# Patient Record
Sex: Male | Born: 1935 | Race: White | Hispanic: No | Smoking: Never smoker
Health system: Southern US, Community
[De-identification: ages and names within clinical notes are randomized; demographics above are authoritative.]

## PROBLEM LIST (undated history)

## (undated) DIAGNOSIS — I10 Essential (primary) hypertension: Secondary | ICD-10-CM

## (undated) DIAGNOSIS — M5116 Intervertebral disc disorders with radiculopathy, lumbar region: Secondary | ICD-10-CM

## (undated) DIAGNOSIS — I2510 Atherosclerotic heart disease of native coronary artery without angina pectoris: Secondary | ICD-10-CM

## (undated) DIAGNOSIS — I712 Thoracic aortic aneurysm, without rupture: Secondary | ICD-10-CM

## (undated) DIAGNOSIS — R972 Elevated prostate specific antigen [PSA]: Secondary | ICD-10-CM

## (undated) DIAGNOSIS — M25511 Pain in right shoulder: Secondary | ICD-10-CM

## (undated) DIAGNOSIS — I110 Hypertensive heart disease with heart failure: Secondary | ICD-10-CM

## (undated) DIAGNOSIS — K219 Gastro-esophageal reflux disease without esophagitis: Secondary | ICD-10-CM

## (undated) DIAGNOSIS — Z950 Presence of cardiac pacemaker: Secondary | ICD-10-CM

## (undated) DIAGNOSIS — I429 Cardiomyopathy, unspecified: Secondary | ICD-10-CM

## (undated) DIAGNOSIS — M25569 Pain in unspecified knee: Secondary | ICD-10-CM

## (undated) DIAGNOSIS — I5032 Chronic diastolic (congestive) heart failure: Secondary | ICD-10-CM

## (undated) DIAGNOSIS — R040 Epistaxis: Secondary | ICD-10-CM

## (undated) DIAGNOSIS — E079 Disorder of thyroid, unspecified: Secondary | ICD-10-CM

## (undated) DIAGNOSIS — Z45018 Encounter for adjustment and management of other part of cardiac pacemaker: Secondary | ICD-10-CM

## (undated) DIAGNOSIS — I441 Atrioventricular block, second degree: Secondary | ICD-10-CM

## (undated) DIAGNOSIS — E785 Hyperlipidemia, unspecified: Secondary | ICD-10-CM

## (undated) DIAGNOSIS — M48061 Spinal stenosis, lumbar region without neurogenic claudication: Secondary | ICD-10-CM

## (undated) DIAGNOSIS — I447 Left bundle-branch block, unspecified: Secondary | ICD-10-CM

## (undated) HISTORY — DX: Gastro-esophageal reflux disease without esophagitis: K21.9

## (undated) HISTORY — PX: APPENDECTOMY: SHX54

## (undated) HISTORY — DX: Spinal stenosis, lumbar region without neurogenic claudication: M48.061

## (undated) HISTORY — DX: Encounter for adjustment and management of other part of cardiac pacemaker: Z45.018

## (undated) HISTORY — DX: Pain in unspecified knee: M25.569

## (undated) HISTORY — DX: Essential (primary) hypertension: I10

## (undated) HISTORY — PX: INSERT / REPLACE / REMOVE PACEMAKER: SUR710

## (undated) HISTORY — DX: Left bundle-branch block, unspecified: I44.7

## (undated) HISTORY — DX: Presence of cardiac pacemaker: Z95.0

## (undated) HISTORY — DX: Epistaxis: R04.0

## (undated) HISTORY — DX: Elevated prostate specific antigen (PSA): R97.20

## (undated) HISTORY — DX: Intervertebral disc disorders with radiculopathy, lumbar region: M51.16

## (undated) HISTORY — DX: Thoracic aortic aneurysm, without rupture: I71.2

## (undated) HISTORY — DX: Hypertensive heart disease with heart failure: I11.0

## (undated) HISTORY — DX: Disorder of thyroid, unspecified: E07.9

## (undated) HISTORY — DX: Atrioventricular block, second degree: I44.1

## (undated) HISTORY — PX: OTHER SURGICAL HISTORY: SHX169

## (undated) HISTORY — DX: Chronic diastolic (congestive) heart failure: I50.32

## (undated) HISTORY — DX: Hyperlipidemia, unspecified: E78.5

## (undated) HISTORY — DX: Cardiomyopathy, unspecified: I42.9

## (undated) HISTORY — DX: Atherosclerotic heart disease of native coronary artery without angina pectoris: I25.10

## (undated) HISTORY — DX: Pain in right shoulder: M25.511

---

## 2013-04-15 ENCOUNTER — Emergency Department: Payer: Self-pay | Admitting: Emergency Medicine

## 2013-04-15 LAB — CBC
MCH: 33.1 pg (ref 26.0–34.0)
MCHC: 34.3 g/dL (ref 32.0–36.0)
Platelet: 164 10*3/uL (ref 150–440)
RDW: 14 % (ref 11.5–14.5)
WBC: 9.7 10*3/uL (ref 3.8–10.6)

## 2013-04-15 LAB — SEDIMENTATION RATE: Erythrocyte Sed Rate: 7 mm/hr (ref 0–20)

## 2013-05-01 ENCOUNTER — Ambulatory Visit: Payer: Self-pay | Admitting: General Practice

## 2013-06-05 ENCOUNTER — Encounter (INDEPENDENT_AMBULATORY_CARE_PROVIDER_SITE_OTHER): Payer: Self-pay

## 2013-06-05 ENCOUNTER — Ambulatory Visit (INDEPENDENT_AMBULATORY_CARE_PROVIDER_SITE_OTHER): Payer: Medicare Other

## 2013-06-05 VITALS — BP 151/86 | HR 81 | Resp 16

## 2013-06-05 DIAGNOSIS — B351 Tinea unguium: Secondary | ICD-10-CM

## 2013-06-05 DIAGNOSIS — M79609 Pain in unspecified limb: Secondary | ICD-10-CM

## 2013-06-05 DIAGNOSIS — L600 Ingrowing nail: Secondary | ICD-10-CM

## 2013-06-05 NOTE — Progress Notes (Signed)
  Subjective:    Patient ID: Erik Brock, male    DOB: 03-30-1928, 79 y.o.   MRN: [id number]  HPI trim my nails historically patient's head nail removal of both great toes more than 5 years ago by Dr. Amalia Hailey. At this time having some pain discomfort with thickening yellowing and discoloration of the second digit right second and third digits left foot.     Review of Systems  Constitutional: Negative.   HENT: Negative.   Eyes: Negative.   Respiratory: Negative.   Cardiovascular: Negative.   Gastrointestinal: Negative.   Endocrine: Negative.   Genitourinary: Negative.   Musculoskeletal: Negative.   Skin: Negative.   Allergic/Immunologic: Negative.   Neurological: Negative.   Hematological: Negative.   Psychiatric/Behavioral: Negative.        Objective:   Physical Exam Neurovascular status is intact with pedal pulses palpable DP +2/4 PT plus one over 4 bilateral skin temperature warm turgor normal no edema rubor pallor or varicosities noted. Neurologically epicritic and proprioceptive sensations intact and symmetric bilateral. DTRs not elicited. Dermatologically skin color pigment normal hair growth absent there is thickening yellowing and criptotic incurvation of 3 digits second right second and third left. The remaining digits are relatively normal trophic nonpainful or symptomatic. The affected nails are tender on touch palpation and with enclosed shoe wear and ambulation. Remainder of orthopedic biomechanical exam unremarkable mild Leksell digital contractures are noted no open wounds ulcerations noted. No secondary infection is noted.     Assessment & Plan:  Onychomycosis/painful incurvated deformity mycotic nails x3. Nails debrided at this time painful and symptomatic with instructions to obtain formula 3 which is dispensed at this visit. Apply to affected nails twice daily for 6-12 months duration. Suggest a 3 month followup for palliative nail care and debridement as needed.  Contact us with any changes or exacerbations occur. Next  Harriet Masson DPM

## 2013-06-05 NOTE — Patient Instructions (Signed)
Onychomycosis/Fungal Toenails  WHAT IS IT? An infection that lies within the keratin of your nail plate that is caused by a fungus.  WHY ME? Fungal infections affect all ages, sexes, races, and creeds.  There may be many factors that predispose you to a fungal infection such as age, coexisting medical conditions such as diabetes, or an autoimmune disease; stress, medications, fatigue, genetics, etc.  Bottom line: fungus thrives in a warm, moist environment and your shoes offer such a location.  IS IT CONTAGIOUS? Theoretically, yes.  You do not want to share shoes, nail clippers or files with someone who has fungal toenails.  Walking around barefoot in the same room or sleeping in the same bed is unlikely to transfer the organism.  It is important to realize, however, that fungus can spread easily from one nail to the next on the same foot.  HOW DO WE TREAT THIS?  There are several ways to treat this condition.  Treatment may depend on many factors such as age, medications, pregnancy, liver and kidney conditions, etc.  It is best to ask your doctor which options are available to you.  1. No treatment.   Unlike many other medical concerns, you can live with this condition.  However for many people this can be a painful condition and may lead to ingrown toenails or a bacterial infection.  It is recommended that you keep the nails cut short to help reduce the amount of fungal nail. 2. Topical treatment.  These range from herbal remedies to prescription strength nail lacquers.  About 40-50% effective, topicals require twice daily application for approximately 9 to 12 months or until an entirely new nail has grown out.  The most effective topicals are medical grade medications available through physicians offices. 3. Oral antifungal medications.  With an 80-90% cure rate, the most common oral medication requires 3 to 4 months of therapy and stays in your system for a year as the new nail grows out.  Oral  antifungal medications do require blood work to make sure it is a safe drug for you.  A liver function panel will be performed prior to starting the medication and after the first month of treatment.  It is important to have the blood work performed to avoid any harmful side effects.  In general, this medication safe but blood work is required. 4. Laser Therapy.  This treatment is performed by applying a specialized laser to the affected nail plate.  This therapy is noninvasive, fast, and non-painful.  It is not covered by insurance and is therefore, out of pocket.  The results have been very good with a 80-95% cure rate.  [facility] is the only practice in the area to offer this therapy. 5. Permanent Nail Avulsion.  Removing the entire nail so that a new nail will not grow back. 6. Formula 3: Topical medication can be obtained here at the office. Applied to the affected nails second toe right foot second and third toes left foot. To be applied sparingly twice a day every morning every evening for a period of 6-12 months minimum. Stop if the cause any skin irritation. Followup of nail trimming on a regular basis.

## 2013-07-30 DIAGNOSIS — E785 Hyperlipidemia, unspecified: Secondary | ICD-10-CM

## 2013-07-30 DIAGNOSIS — M48061 Spinal stenosis, lumbar region without neurogenic claudication: Secondary | ICD-10-CM

## 2013-07-30 DIAGNOSIS — R972 Elevated prostate specific antigen [PSA]: Secondary | ICD-10-CM | POA: Insufficient documentation

## 2013-07-30 DIAGNOSIS — I10 Essential (primary) hypertension: Secondary | ICD-10-CM

## 2013-07-30 DIAGNOSIS — K219 Gastro-esophageal reflux disease without esophagitis: Secondary | ICD-10-CM | POA: Insufficient documentation

## 2013-07-30 HISTORY — DX: Elevated prostate specific antigen (PSA): R97.20

## 2013-07-30 HISTORY — DX: Hyperlipidemia, unspecified: E78.5

## 2013-07-30 HISTORY — DX: Spinal stenosis, lumbar region without neurogenic claudication: M48.061

## 2013-07-30 HISTORY — DX: Essential (primary) hypertension: I10

## 2013-07-30 HISTORY — DX: Gastro-esophageal reflux disease without esophagitis: K21.9

## 2013-08-29 ENCOUNTER — Ambulatory Visit: Payer: Medicare Other

## 2013-08-29 VITALS — BP 130/73 | HR 73 | Resp 16

## 2013-08-29 DIAGNOSIS — B351 Tinea unguium: Secondary | ICD-10-CM

## 2013-08-29 DIAGNOSIS — L600 Ingrowing nail: Secondary | ICD-10-CM

## 2013-08-29 DIAGNOSIS — M79609 Pain in unspecified limb: Secondary | ICD-10-CM

## 2013-08-29 NOTE — Progress Notes (Signed)
   Subjective:    Patient ID: Erik Brock, male    DOB: [birth year], 80 y.o.   MRN: [id number]  HPI I am here to get my toenails cut and there is no change in health or medicines    Review of Systems no new changes or findings     Objective:   Physical Exam Vascular status is intact and unchanged pedal pulses palpable DP +2/4 bilateral PT plus one over 4 bilateral Refill timed 3-4 seconds skin temperature warm turgor normal no edema rubor or varicosities noted. Epicritic and progressive sensations intact bilateral. Dermatologically skin color pigment normal hair growth absent nails thick brittle criptotic incurvated friable 1 through 5 right 2 through 5 left left hallux has had nails removed although they're 2 small spicule still present. No secondary infection is no discharge or drainage no malodor.       Assessment & Plan:  Assessment onychomycosis criptotic is incurvation and dystrophy of nails painful tender symptomatic x9 cleansed and debridement of painful mycotic nails return in 3 months for continued palliative nail care and as-needed basis for future followup  Harriet Masson DPM

## 2013-08-29 NOTE — Patient Instructions (Signed)

## 2013-09-04 ENCOUNTER — Ambulatory Visit: Payer: Medicare Other

## 2013-12-05 ENCOUNTER — Ambulatory Visit (INDEPENDENT_AMBULATORY_CARE_PROVIDER_SITE_OTHER): Payer: Medicare Other

## 2013-12-05 VITALS — BP 120/62 | HR 64 | Resp 18

## 2013-12-05 DIAGNOSIS — B351 Tinea unguium: Secondary | ICD-10-CM

## 2013-12-05 DIAGNOSIS — M79609 Pain in unspecified limb: Secondary | ICD-10-CM

## 2013-12-05 NOTE — Patient Instructions (Signed)
Ringworm, Nail A fungal infection of the nail (tinea unguium/onychomycosis) is common. It is common as the visible part of the nail is composed of dead cells which have no blood supply to help prevent infection. It occurs because fungi are everywhere and will pick any opportunity to grow on any dead material. Because nails are very slow growing they require up to 2 years of treatment with anti-fungal medications. The entire nail back to the base is infected. This includes approximately  of the nail which you cannot see. If your caregiver has prescribed a medication by mouth, take it every day and as directed. No progress will be seen for at least 6 to 9 months. Do not be disappointed! Because fungi live on dead cells with little or no exposure to blood supply, medication delivery to the infection is slow; thus the cure is slow. It is also why you can observe no progress in the first 6 months. The nail becoming cured is the base of the nail, as it has the blood supply. Topical medication such as creams and ointments are usually not effective. Important in successful treatment of nail fungus is closely following the medication regimen that your doctor prescribes. Sometimes you and your caregiver may elect to speed up this process by surgical removal of all the nails. Even this may still require 6 to 9 months of additional oral medications. See your caregiver as directed. Remember there will be no visible improvement for at least 6 months. See your caregiver sooner if other signs of infection (redness and swelling) develop. Document Released: 07/01/2000 Document Revised: 09/26/2011 Document Reviewed: 09/09/2008 [employer] Patient Information 2014 [employer].  

## 2013-12-05 NOTE — Progress Notes (Signed)
   Subjective:    Patient ID: Erik Brock, male    DOB: [birth year], 80 y.o.   MRN: [id number]  HPI I am here to get my toenails trimmed up     Review of Systems No new changes or systemic findings noted    Objective:   Physical Exam  lower extremity objective findings neurovascular status is intact pedal pulses palpable nails thick criptotic incurvated 2 through 5 bilateral small speculum left hallux nails debrided as well no secondary infection is no discharge or drainage       Assessment & Plan:  Assessment this time is mycotic nails criptotic nails with discoloration and friability tenderness 2 through 5 bilateral mycotic nails debrided x8 this time return for future palliative care and as-needed basis  Harriet Masson DPM

## 2014-03-06 ENCOUNTER — Ambulatory Visit: Payer: Medicare Other

## 2014-03-06 VITALS — BP 151/77 | HR 69 | Resp 18

## 2014-03-06 DIAGNOSIS — M79676 Pain in unspecified toe(s): Secondary | ICD-10-CM

## 2014-03-06 DIAGNOSIS — B351 Tinea unguium: Secondary | ICD-10-CM

## 2014-03-06 DIAGNOSIS — L600 Ingrowing nail: Secondary | ICD-10-CM

## 2014-03-06 DIAGNOSIS — M79609 Pain in unspecified limb: Secondary | ICD-10-CM

## 2014-03-06 NOTE — Patient Instructions (Signed)
Onychomycosis/Fungal Toenails  WHAT IS IT? An infection that lies within the keratin of your nail plate that is caused by a fungus.  WHY ME? Fungal infections affect all ages, sexes, races, and creeds.  There may be many factors that predispose you to a fungal infection such as age, coexisting medical conditions such as diabetes, or an autoimmune disease; stress, medications, fatigue, genetics, etc.  Bottom line: fungus thrives in a warm, moist environment and your shoes offer such a location.  IS IT CONTAGIOUS? Theoretically, yes.  You do not want to share shoes, nail clippers or files with someone who has fungal toenails.  Walking around barefoot in the same room or sleeping in the same bed is unlikely to transfer the organism.  It is important to realize, however, that fungus can spread easily from one nail to the next on the same foot.  HOW DO WE TREAT THIS?  There are several ways to treat this condition.  Treatment may depend on many factors such as age, medications, pregnancy, liver and kidney conditions, etc.  It is best to ask your doctor which options are available to you.  1. No treatment.   Unlike many other medical concerns, you can live with this condition.  However for many people this can be a painful condition and may lead to ingrown toenails or a bacterial infection.  It is recommended that you keep the nails cut short to help reduce the amount of fungal nail. 2. Topical treatment.  These range from herbal remedies to prescription strength nail lacquers.  About 40-50% effective, topicals require twice daily application for approximately 9 to 12 months or until an entirely new nail has grown out.  The most effective topicals are medical grade medications available through physicians offices. 3. Oral antifungal medications.  With an 80-90% cure rate, the most common oral medication requires 3 to 4 months of therapy and stays in your system for a year as the new nail grows out.  Oral  antifungal medications do require blood work to make sure it is a safe drug for you.  A liver function panel will be performed prior to starting the medication and after the first month of treatment.  It is important to have the blood work performed to avoid any harmful side effects.  In general, this medication safe but blood work is required. 4. Laser Therapy.  This treatment is performed by applying a specialized laser to the affected nail plate.  This therapy is noninvasive, fast, and non-painful.  It is not covered by insurance and is therefore, out of pocket.  The results have been very good with a 80-95% cure rate.  [facility] is the only practice in the area to offer this therapy. 5. Permanent Nail Avulsion.  Removing the entire nail so that a new nail will not grow back.  Continue applying topical nail antifungal suggested formula 3. Applied twice daily to each affected toenail for minimum of 12 months

## 2014-03-06 NOTE — Progress Notes (Addendum)
   Subjective:    Patient ID: Erik Brock, male    DOB: Mar 17, 1928, 80 y.o.   MRN: [id number]  HPI I AM HERE TO HAVE MY TOENAILS TRIMMED UP    Review of Systems no new findings or systemic changes noted     Objective:   Physical Exam Extremity objective findings as follows vascular status is intact with pedal pulses palpable DP +2/4 PT posterior were for Refill time 3 seconds all digits epicritic sensations intact and symmetric there is normal plantar response and DT the keratin was in nails thick dystrophic and brittle 2 through 5 bilateral they're spicules the hallux nails bilateral on previous nail excisions remaining nails show yellow thickening discoloration and friability consistent with onychomycosis.       Assessment & Plan:  Assessment onychomycosis painful criptotic incurvated nails 2 through 5 bilateral debrided painful mycotic nails x8 also some spicules both hallux are also debrided recommended to continue with topical formula 3 antifungal application as instructed reappointed 3 months for followup for nail check and debridement as needed  Harriet Masson DPM

## 2014-06-04 ENCOUNTER — Ambulatory Visit: Payer: PRIVATE HEALTH INSURANCE

## 2014-06-16 ENCOUNTER — Ambulatory Visit (INDEPENDENT_AMBULATORY_CARE_PROVIDER_SITE_OTHER): Payer: Medicare Other

## 2014-06-16 VITALS — BP 124/69 | HR 60 | Resp 12

## 2014-06-16 DIAGNOSIS — B351 Tinea unguium: Secondary | ICD-10-CM

## 2014-06-16 DIAGNOSIS — M79676 Pain in unspecified toe(s): Secondary | ICD-10-CM

## 2014-06-16 NOTE — Progress Notes (Signed)
   Subjective:    Patient ID: Erik Brock, male    DOB: 02-14-1928, 80 y.o.   MRN: [id number]  HPI  TRIM MY TOENAILS.  Review of Systems no new findings or systemic changes. Patient's recent A1c was 5.7 there is some concern that isn't a prediabetic state although currently diet controlled       Objective:   Physical Exam 80 year old male well-developed well-nourished oriented 3 presents this time with pedal pulses palpable DP +2 over 4 PT +2 over 4 capillary refill time 3 seconds all digits epicritic and proprioceptive sensations appear to be intact although diminished on Semmes Weinstein to the digits nails thick criptotic incurvated 2 through 5 bilateral small nail spicule was removed from both hallux as well this time. No open wounds no ulcers no secondary infections       Assessment & Plan:  Assessment possibly early prediabetic state has thick painful mycotic nails 2 through 5 bilateral which are debrided at this time return in 3 months for follow-up and continued palliative nail care is needed maintain appropriate accommodative shoes at all times  Harriet Masson DPM

## 2014-06-16 NOTE — Patient Instructions (Signed)

## 2014-09-15 ENCOUNTER — Ambulatory Visit (INDEPENDENT_AMBULATORY_CARE_PROVIDER_SITE_OTHER): Payer: Medicare Other

## 2014-09-15 VITALS — BP 125/71 | HR 70 | Resp 18

## 2014-09-15 DIAGNOSIS — B351 Tinea unguium: Secondary | ICD-10-CM

## 2014-09-15 DIAGNOSIS — L600 Ingrowing nail: Secondary | ICD-10-CM

## 2014-09-15 DIAGNOSIS — M79676 Pain in unspecified toe(s): Secondary | ICD-10-CM | POA: Diagnosis not present

## 2014-09-15 NOTE — Progress Notes (Signed)
   Subjective:    Patient ID: Erik Brock, male    DOB: 11-Feb-1928, 81 y.o.   MRN: [id number]  HPI I AM HERE TO GET MY TOENAILS TRIMMED UP    Review of Systems no new findings or systemic changes     Objective:   Physical Exam pedal pulses are palpable epicritic and proprioceptive sensations intact DP +2 PT +2 over 4 bilateral Refill time 3 seconds no open wounds no ulcers no secondary infections patient is had previous nail excisions both hallux is incurvated brittle crumbly dystrophic nails 2 through 5 bilateral. Small spicule on the left hallux nails also debridement this time.      Assessment & Plan:  Assessment this time thick painful criptotic nails 2 through 5 bilateral debrided at this time the presence of pain symptomology and nail dystrophy and discoloration has had previous nail excisions both hallux with good success nail spicule left hallux also debridement at this time return in 3 months for follow-up continued palliative care in the future as needed  Harriet Masson DPM

## 2014-10-14 DIAGNOSIS — M25511 Pain in right shoulder: Secondary | ICD-10-CM

## 2014-10-14 HISTORY — DX: Pain in right shoulder: M25.511

## 2014-11-11 DIAGNOSIS — M25569 Pain in unspecified knee: Secondary | ICD-10-CM | POA: Insufficient documentation

## 2014-11-11 DIAGNOSIS — M5116 Intervertebral disc disorders with radiculopathy, lumbar region: Secondary | ICD-10-CM | POA: Insufficient documentation

## 2014-11-11 HISTORY — DX: Intervertebral disc disorders with radiculopathy, lumbar region: M51.16

## 2014-11-11 HISTORY — DX: Pain in unspecified knee: M25.569

## 2014-12-08 DIAGNOSIS — I447 Left bundle-branch block, unspecified: Secondary | ICD-10-CM | POA: Insufficient documentation

## 2014-12-08 DIAGNOSIS — I2510 Atherosclerotic heart disease of native coronary artery without angina pectoris: Secondary | ICD-10-CM | POA: Insufficient documentation

## 2014-12-08 DIAGNOSIS — I441 Atrioventricular block, second degree: Secondary | ICD-10-CM

## 2014-12-08 HISTORY — DX: Left bundle-branch block, unspecified: I44.7

## 2014-12-08 HISTORY — DX: Atrioventricular block, second degree: I44.1

## 2014-12-08 HISTORY — DX: Atherosclerotic heart disease of native coronary artery without angina pectoris: I25.10

## 2014-12-22 ENCOUNTER — Ambulatory Visit: Payer: Medicare Other

## 2014-12-22 ENCOUNTER — Encounter: Payer: Self-pay | Admitting: Podiatry

## 2014-12-22 ENCOUNTER — Ambulatory Visit (INDEPENDENT_AMBULATORY_CARE_PROVIDER_SITE_OTHER): Payer: Medicare Other | Admitting: Podiatry

## 2014-12-22 VITALS — BP 119/67 | HR 75 | Resp 18

## 2014-12-22 DIAGNOSIS — Z950 Presence of cardiac pacemaker: Secondary | ICD-10-CM

## 2014-12-22 DIAGNOSIS — Z45018 Encounter for adjustment and management of other part of cardiac pacemaker: Secondary | ICD-10-CM

## 2014-12-22 DIAGNOSIS — M79676 Pain in unspecified toe(s): Secondary | ICD-10-CM

## 2014-12-22 DIAGNOSIS — B351 Tinea unguium: Secondary | ICD-10-CM | POA: Diagnosis not present

## 2014-12-22 HISTORY — DX: Encounter for adjustment and management of other part of cardiac pacemaker: Z45.018

## 2014-12-22 HISTORY — DX: Presence of cardiac pacemaker: Z95.0

## 2014-12-22 NOTE — Progress Notes (Signed)
Patient ID: Erik Brock, male   DOB: 1928/04/25, 81 y.o.   MRN: [id number] @BARCODE2D (Error - No data available.)@Complaint :  Visit Type: Patient returns to my office for continued preventative foot care services. Complaint: Patient states" my nails have grown long and thick and become painful to walk and wear shoes" . He presents for preventative foot care services. No changes to ROS  Podiatric Exam: Vascular: dorsalis pedis and posterior tibial pulses are palpable bilateral. Capillary return is immediate. Temperature gradient is WNL. Skin turgor WNL  Sensorium: Normal Semmes Weinstein monofilament test. Normal tactile sensation bilaterally. Nail Exam: Pt has thick disfigured discolored nails with subungual debris noted bilateral entire nail hallux through fifth toenails Ulcer Exam: There is no evidence of ulcer or pre-ulcerative changes or infection. Orthopedic Exam: Muscle tone and strength are WNL. No limitations in general ROM. No crepitus or effusions noted. Foot type and digits show no abnormalities. Bony prominences are unremarkable. Skin: No Porokeratosis. No infection or ulcers  Diagnosis:  Tinea unguium, Pain in right toe, pain in left toes  Treatment & Plan Procedures and Treatment: Consent by patient was obtained for treatment procedures. The patient understood the discussion of treatment and procedures well. All questions were answered thoroughly reviewed. Debridement of mycotic and hypertrophic toenails, 1 through 5 bilateral and clearing of subungual debris. No ulceration, no infection noted.  Return Visit-Office Procedure: Patient instructed to return to the office for a follow up visit 3 months for continued evaluation and treatment.

## 2015-03-30 ENCOUNTER — Encounter: Payer: Self-pay | Admitting: Podiatry

## 2015-03-30 ENCOUNTER — Ambulatory Visit (INDEPENDENT_AMBULATORY_CARE_PROVIDER_SITE_OTHER): Payer: Medicare Other | Admitting: Podiatry

## 2015-03-30 DIAGNOSIS — B351 Tinea unguium: Secondary | ICD-10-CM | POA: Diagnosis not present

## 2015-03-30 DIAGNOSIS — M79676 Pain in unspecified toe(s): Secondary | ICD-10-CM

## 2015-03-30 NOTE — Progress Notes (Signed)
Patient ID: Erik Brock, male   DOB: [birth year], 81 y.o.   MRN: [id number] @BARCODE2D (Error - No data available.)@Complaint :  Visit Type: Patient returns to my office for continued preventative foot care services. Complaint: Patient states" my nails have grown long and thick and become painful to walk and wear shoes" . He presents for preventative foot care services. No changes to ROS  Podiatric Exam: Vascular: dorsalis pedis and posterior tibial pulses are palpable bilateral. Capillary return is immediate. Temperature gradient is WNL. Skin turgor WNL  Sensorium: Normal Semmes Weinstein monofilament test. Normal tactile sensation bilaterally. Nail Exam: Pt has thick disfigured discolored nails with subungual debris noted  hallux through 2-5 B/L.  Nail spicule left hallux nail. Nail plate removed from both hallux nail beds. Ulcer Exam: There is no evidence of ulcer or pre-ulcerative changes or infection. Orthopedic Exam: Muscle tone and strength are WNL. No limitations in general ROM. No crepitus or effusions noted. Foot type and digits show no abnormalities. Bony prominences are unremarkable. Skin: No Porokeratosis. No infection or ulcers  Diagnosis:  Tinea unguium, Pain in right toe, pain in left toes  Treatment & Plan Procedures and Treatment: Consent by patient was obtained for treatment procedures. The patient understood the discussion of treatment and procedures well. All questions were answered thoroughly reviewed. Debridement of mycotic and hypertrophic toenails, 1 through 5 bilateral and clearing of subungual debris. No ulceration, no infection noted.  Return Visit-Office Procedure: Patient instructed to return to the office for a follow up visit 3 months for continued evaluation and treatment.

## 2015-04-05 DIAGNOSIS — I5032 Chronic diastolic (congestive) heart failure: Secondary | ICD-10-CM

## 2015-04-05 DIAGNOSIS — E785 Hyperlipidemia, unspecified: Secondary | ICD-10-CM

## 2015-04-05 DIAGNOSIS — I110 Hypertensive heart disease with heart failure: Secondary | ICD-10-CM | POA: Insufficient documentation

## 2015-04-05 HISTORY — DX: Hypertensive heart disease with heart failure: I11.0

## 2015-04-05 HISTORY — DX: Hyperlipidemia, unspecified: E78.5

## 2015-04-05 HISTORY — DX: Chronic diastolic (congestive) heart failure: I50.32

## 2015-06-08 ENCOUNTER — Other Ambulatory Visit: Payer: Self-pay | Admitting: Orthopedic Surgery

## 2015-06-08 DIAGNOSIS — M546 Pain in thoracic spine: Secondary | ICD-10-CM

## 2015-06-18 ENCOUNTER — Ambulatory Visit
Admission: RE | Admit: 2015-06-18 | Discharge: 2015-06-18 | Disposition: A | Discharge status: Home / Self Care | Payer: Medicare Other | Source: Ambulatory Visit | Attending: Orthopedic Surgery | Admitting: Orthopedic Surgery

## 2015-06-18 DIAGNOSIS — M546 Pain in thoracic spine: Secondary | ICD-10-CM | POA: Diagnosis not present

## 2015-06-18 DIAGNOSIS — I700 Atherosclerosis of aorta: Secondary | ICD-10-CM | POA: Diagnosis not present

## 2015-06-18 DIAGNOSIS — I517 Cardiomegaly: Secondary | ICD-10-CM | POA: Insufficient documentation

## 2015-06-18 DIAGNOSIS — I2510 Atherosclerotic heart disease of native coronary artery without angina pectoris: Secondary | ICD-10-CM | POA: Diagnosis not present

## 2015-06-18 DIAGNOSIS — M47892 Other spondylosis, cervical region: Secondary | ICD-10-CM | POA: Insufficient documentation

## 2015-06-29 ENCOUNTER — Encounter: Payer: Self-pay | Admitting: Podiatry

## 2015-06-29 ENCOUNTER — Ambulatory Visit (INDEPENDENT_AMBULATORY_CARE_PROVIDER_SITE_OTHER): Payer: Medicare Other | Admitting: Podiatry

## 2015-06-29 DIAGNOSIS — B351 Tinea unguium: Secondary | ICD-10-CM

## 2015-06-29 DIAGNOSIS — M79676 Pain in unspecified toe(s): Secondary | ICD-10-CM

## 2015-06-29 NOTE — Progress Notes (Signed)
Patient ID: Erik Brock, male   DOB: May 28, 1928, 81 y.o.   MRN: [id number] Complaint:  Visit Type: Patient returns to my office for continued preventative foot care services. Complaint: Patient states" my nails have grown long and thick and become painful to walk and wear shoes". The patient presents for preventative foot care services. No changes to ROS  Podiatric Exam: Vascular: dorsalis pedis and posterior tibial pulses are palpable bilateral. Capillary return is immediate. Temperature gradient is WNL. Skin turgor WNL  Sensorium: Normal Semmes Weinstein monofilament test. Normal tactile sensation bilaterally. Nail Exam: Pt has thick disfigured discolored nails with subungual debris noted bilateral two through five B/L Ulcer Exam: There is no evidence of ulcer or pre-ulcerative changes or infection. Orthopedic Exam: Muscle tone and strength are WNL. No limitations in general ROM. No crepitus or effusions noted. Foot type and digits show no abnormalities. Bony prominences are unremarkable. Skin: No Porokeratosis. No infection or ulcers  Diagnosis:  Onychomycosis, , Pain in right toe, pain in left toes  Treatment & Plan Procedures and Treatment: Consent by patient was obtained for treatment procedures. The patient understood the discussion of treatment and procedures well. All questions were answered thoroughly reviewed. Debridement of mycotic and hypertrophic toenails, 1 through 5 bilateral and clearing of subungual debris. No ulceration, no infection noted.  Return Visit-Office Procedure: Patient instructed to return to the office for a follow up visit 3 months for continued evaluation and treatment.    Gardiner Barefoot DPM

## 2015-09-28 ENCOUNTER — Ambulatory Visit (INDEPENDENT_AMBULATORY_CARE_PROVIDER_SITE_OTHER): Payer: Medicare Other | Admitting: Podiatry

## 2015-09-28 ENCOUNTER — Encounter: Payer: Self-pay | Admitting: Podiatry

## 2015-09-28 DIAGNOSIS — B351 Tinea unguium: Secondary | ICD-10-CM

## 2015-09-28 DIAGNOSIS — M79676 Pain in unspecified toe(s): Secondary | ICD-10-CM

## 2015-09-28 NOTE — Progress Notes (Signed)
Patient ID: Erik Brock, male   DOB: Aug 19, 1927, 82 y.o.   MRN: [id number] Complaint:  Visit Type: Patient returns to my office for continued preventative foot care services. Complaint: Patient states" my nails have grown long and thick and become painful to walk and wear shoes". The patient presents for preventative foot care services. No changes to ROS  Podiatric Exam: Vascular: dorsalis pedis and posterior tibial pulses are palpable bilateral. Capillary return is immediate. Temperature gradient is WNL. Skin turgor WNL  Sensorium: Normal Semmes Weinstein monofilament test. Normal tactile sensation bilaterally. Nail Exam: Pt has thick disfigured discolored nails with subungual debris noted bilateral two through five B/L Ulcer Exam: There is no evidence of ulcer or pre-ulcerative changes or infection. Orthopedic Exam: Muscle tone and strength are WNL. No limitations in general ROM. No crepitus or effusions noted. Foot type and digits show no abnormalities. Bony prominences are unremarkable. Skin: No Porokeratosis. No infection or ulcers  Diagnosis:  Onychomycosis, , Pain in right toe, pain in left toes  Treatment & Plan Procedures and Treatment: Consent by patient was obtained for treatment procedures. The patient understood the discussion of treatment and procedures well. All questions were answered thoroughly reviewed. Debridement of mycotic and hypertrophic toenails, 1 through 5 bilateral and clearing of subungual debris. No ulceration, no infection noted.  Return Visit-Office Procedure: Patient instructed to return to the office for a follow up visit 3 months for continued evaluation and treatment.    Gardiner Barefoot DPM

## 2015-12-07 ENCOUNTER — Encounter: Payer: Self-pay | Admitting: Podiatry

## 2015-12-07 ENCOUNTER — Ambulatory Visit (INDEPENDENT_AMBULATORY_CARE_PROVIDER_SITE_OTHER): Payer: Medicare Other | Admitting: Podiatry

## 2015-12-07 DIAGNOSIS — S91209A Unspecified open wound of unspecified toe(s) with damage to nail, initial encounter: Secondary | ICD-10-CM

## 2015-12-07 DIAGNOSIS — M79676 Pain in unspecified toe(s): Secondary | ICD-10-CM | POA: Diagnosis not present

## 2015-12-07 DIAGNOSIS — B351 Tinea unguium: Secondary | ICD-10-CM

## 2015-12-07 NOTE — Patient Instructions (Signed)
Betadine Soak Instructions  Purchase an 8 oz. bottle of BETADINE solution (Povidone)  THE DAY AFTER THE PROCEDURE  Place 1 tablespoon of betadine solution in a quart of warm tap water.  Submerge your foot or feet with outer bandage intact for the initial soak; this will allow the bandage to become moist and wet for easy lift off.  Once you remove your bandage, continue to soak in the solution for 20 minutes.  This soak should be done twice a day.  Next, remove your foot or feet from solution, blot dry the affected area and cover.  You may use a band aid large enough to cover the area or use gauze and tape.  Apply other medications to the area as directed by the doctor such as cortisporin otic solution (ear drops) or neosporin.   IF YOUR SKIN BECOMES IRRITATED WHILE USING THESE INSTRUCTIONS, IT IS OKAY TO SWITCH TO EPSOM SALTS AND WATER.   Place 1/4 cup of epsom salts in a quart of warm tap water.  Submerge your foot or feet with outer bandage intact for the initial soak; this will allow the bandage to become moist and wet for easy lift off.  Once you remove your bandage, continue to soak in the solution for 20 minutes.  This soak should be done twice a day.  Next, remove your foot or feet from solution, blot dry the affected area and cover.  You may use a band aid large enough to cover the area or use gauze and tape.  Apply other medications to the area as directed by the doctor such as polysporin neosporin.  IF YOUR SKIN BECOMES IRRITATED WHILE USING THESE INSTRUCTIONS, IT IS OKAY TO SWITCH TO  WHITE VINEGAR AND WATER.    [facility] Instructions-Post Nail Surgery  You have had your ingrown toenail and root treated with a chemical.  This chemical causes a burn that will drain and ooze like a blister.  This can drain for 6-8 weeks or longer.  It is important to keep this area clean, covered, and follow the soaking instructions dispensed at the time of your surgery.  This area will eventually dry  and form a scab.  Once the scab forms you no longer need to soak or apply a dressing.  If at any time you experience an increase in pain, redness, swelling, or drainage, you should contact the office as soon as possible.   Monitor for any signs/symptoms of infection. Call the office immediately if any occur or go directly to the emergency room. Call with any questions/concerns.

## 2015-12-07 NOTE — Progress Notes (Signed)
Patient ID: Erik Brock, male   DOB: 06-Oct-1927, 82 y.o.   MRN: [id number] Complaint:  Visit Type: Patient returns to my office for continued preventative foot care services. Complaint: Patient states" my nails have grown long and thick and become painful to walk and wear shoes". The patient presents for preventative foot care services. No changes to ROS.  Patient admits to catching his fifth toe in his bedsheets and his toenail right foot is coming off.  Podiatric Exam: Vascular: dorsalis pedis and posterior tibial pulses are palpable bilateral. Capillary return is immediate. Temperature gradient is WNL. Skin turgor WNL  Sensorium: Normal Semmes Weinstein monofilament test. Normal tactile sensation bilaterally. Nail Exam: Pt has thick disfigured discolored nails with subungual debris noted bilateral two through five B/L.  Fifth toenail right foot is attached only proximally.  No swelling drainage or infection noted. Ulcer Exam: There is no evidence of ulcer or pre-ulcerative changes or infection. Orthopedic Exam: Muscle tone and strength are WNL. No limitations in general ROM. No crepitus or effusions noted. Foot type and digits show no abnormalities. Bony prominences are unremarkable. Skin: No Porokeratosis. No infection or ulcers  Diagnosis:  Onychomycosis, , Pain in right toe, pain in left toes,  Nail avulsion fifth toenail right foot.  Treatment & Plan Procedures and Treatment: Consent by patient was obtained for treatment procedures. The patient understood the discussion of treatment and procedures well. All questions were answered thoroughly reviewed. Debridement of mycotic and hypertrophic toenails, 1 through 5 bilateral and clearing of subungual debris. No ulceration, no infection noted. Nail avulsion after local anesthesia/ silvadene/ DSD. Return Visit-Office Procedure: Patient instructed to return to the office for a follow up visit 3 months for continued evaluation and  treatment.    Gardiner Barefoot DPM

## 2016-01-04 ENCOUNTER — Ambulatory Visit: Payer: Medicare Other | Admitting: Podiatry

## 2016-03-14 ENCOUNTER — Ambulatory Visit: Payer: Medicare Other | Admitting: Podiatry

## 2016-03-28 ENCOUNTER — Ambulatory Visit (INDEPENDENT_AMBULATORY_CARE_PROVIDER_SITE_OTHER): Payer: Medicare Other | Admitting: Podiatry

## 2016-03-28 ENCOUNTER — Encounter: Payer: Self-pay | Admitting: Podiatry

## 2016-03-28 DIAGNOSIS — M79676 Pain in unspecified toe(s): Secondary | ICD-10-CM

## 2016-03-28 DIAGNOSIS — B351 Tinea unguium: Secondary | ICD-10-CM

## 2016-03-28 NOTE — Progress Notes (Signed)
Patient ID: Erik Brock, male   DOB: Jun 10, 1928, 82 y.o.   MRN: [id number] Complaint:  Visit Type: Patient returns to my office for continued preventative foot care services. Complaint: Patient states" my nails have grown long and thick and become painful to walk and wear shoes". The patient presents for preventative foot care services. No changes to ROS.  Patient admits to catching his fifth toe in his bedsheets and his toenail right foot is coming off.  Podiatric Exam: Vascular: dorsalis pedis and posterior tibial pulses are palpable bilateral. Capillary return is immediate. Temperature gradient is WNL. Skin turgor WNL  Sensorium: Normal Semmes Weinstein monofilament test. Normal tactile sensation bilaterally. Nail Exam: Pt has thick disfigured discolored nails with subungual debris noted bilateral two through five B/L.  Healed fifth toe right foot. Ulcer Exam: There is no evidence of ulcer or pre-ulcerative changes or infection. Orthopedic Exam: Muscle tone and strength are WNL. No limitations in general ROM. No crepitus or effusions noted. Foot type and digits show no abnormalities. Bony prominences are unremarkable. Skin: No Porokeratosis. No infection or ulcers  Diagnosis:  Onychomycosis, , Pain in right toe, pain in left toes,  Nail avulsion fifth toenail right foot.  Treatment & Plan Procedures and Treatment: Consent by patient was obtained for treatment procedures. The patient understood the discussion of treatment and procedures well. All questions were answered thoroughly reviewed. Debridement of mycotic and hypertrophic toenails, 1 through 5 bilateral and clearing of subungual debris. No ulceration, no infection noted Return Visit-Office Procedure: Patient instructed to return to the office for a follow up visit 3 months for continued evaluation and treatment.    Gardiner Barefoot DPM

## 2016-06-27 ENCOUNTER — Encounter: Payer: Self-pay | Admitting: Podiatry

## 2016-06-27 ENCOUNTER — Ambulatory Visit (INDEPENDENT_AMBULATORY_CARE_PROVIDER_SITE_OTHER): Payer: Medicare Other | Admitting: Podiatry

## 2016-06-27 VITALS — Ht 67.0 in | Wt 180.0 lb

## 2016-06-27 DIAGNOSIS — M79676 Pain in unspecified toe(s): Secondary | ICD-10-CM

## 2016-06-27 DIAGNOSIS — B351 Tinea unguium: Secondary | ICD-10-CM

## 2016-06-27 NOTE — Progress Notes (Signed)
Patient ID: Erik Brock, male   DOB: 1927/12/09, 82 y.o.   MRN: [id number] Complaint:  Visit Type: Patient returns to my office for continued preventative foot care services. Complaint: Patient states" my nails have grown long and thick and become painful to walk and wear shoes". The patient presents for preventative foot care services. No changes to ROS.  Patient admits to catching his fifth toe in his bedsheets and his toenail right foot is coming off.  Podiatric Exam: Vascular: dorsalis pedis and posterior tibial pulses are palpable bilateral. Capillary return is immediate. Temperature gradient is WNL. Skin turgor WNL  Sensorium: Normal Semmes Weinstein monofilament test. Normal tactile sensation bilaterally. Nail Exam: Pt has thick disfigured discolored nails with subungual debris noted bilateral two through five B/L.   Ulcer Exam: There is no evidence of ulcer or pre-ulcerative changes or infection. Orthopedic Exam: Muscle tone and strength are WNL. No limitations in general ROM. No crepitus or effusions noted. Foot type and digits show no abnormalities. Bony prominences are unremarkable. Skin: No Porokeratosis. No infection or ulcers  Diagnosis:  Onychomycosis, , Pain in right toe, pain in left toes,    Treatment & Plan Procedures and Treatment: Consent by patient was obtained for treatment procedures. The patient understood the discussion of treatment and procedures well. All questions were answered thoroughly reviewed. Debridement of mycotic and hypertrophic toenails, 1 through 5 bilateral and clearing of subungual debris. No ulceration, no infection noted Return Visit-Office Procedure: Patient instructed to return to the office for a follow up visit 3 months for continued evaluation and treatment.    Gardiner Barefoot DPM

## 2016-09-26 ENCOUNTER — Ambulatory Visit: Payer: Medicare Other | Admitting: Podiatry

## 2016-09-29 ENCOUNTER — Ambulatory Visit (INDEPENDENT_AMBULATORY_CARE_PROVIDER_SITE_OTHER): Payer: Medicare Other | Admitting: Sports Medicine

## 2016-09-29 ENCOUNTER — Encounter: Payer: Self-pay | Admitting: Sports Medicine

## 2016-09-29 DIAGNOSIS — M79676 Pain in unspecified toe(s): Secondary | ICD-10-CM

## 2016-09-29 DIAGNOSIS — B351 Tinea unguium: Secondary | ICD-10-CM

## 2016-09-29 NOTE — Progress Notes (Signed)
Subjective: Erik Brock is a 83 y.o. male patient seen today in office with complaint of painful thickened and elongated toenails; unable to trim. Patient denies history of Diabetes or Vascular disease. 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 Patient has no other pedal complaints at this time.   Patient Active Problem List   Diagnosis Date Noted  . Chronic diastolic heart failure ([place]) 04/05/2015  . Dyslipidemia 04/05/2015  . Hypertensive heart disease with heart failure ([place]) 04/05/2015  . Artificial cardiac pacemaker 12/22/2014  . Atrioventricular block, Mobitz type 2 12/08/2014  . Arteriosclerosis of coronary artery 12/08/2014  . Block, bundle branch, left 12/08/2014  . Gonalgia 11/11/2014  . Neuritis or radiculitis due to rupture of lumbar intervertebral disc 11/11/2014  . Arthralgia of shoulder 10/14/2014  . Elevated prostate specific antigen (PSA) 07/30/2013  . Acid reflux 07/30/2013  . HLD (hyperlipidemia) 07/30/2013  . BP (high blood pressure) 07/30/2013  . Lumbar canal stenosis 07/30/2013    Current Outpatient Prescriptions on File Prior to Visit  Medication Sig Dispense Refill  . aspirin 325 MG tablet Take 325 mg by mouth daily.    Marland Kitchen aspirin 325 MG tablet Take 325 mg by mouth.    . finasteride (PROSCAR) 5 MG tablet Take 5 mg by mouth.    . gabapentin (NEURONTIN) 300 MG capsule Take 300 mg by mouth.    Marland Kitchen lisinopril (PRINIVIL,ZESTRIL) 20 MG tablet Take 20 mg by mouth daily.    Marland Kitchen lisinopril (PRINIVIL,ZESTRIL) 20 MG tablet Take by mouth.    Marland Kitchen lisinopril (PRINIVIL,ZESTRIL) 40 MG tablet Take 40 mg by mouth.    . meloxicam (MOBIC) 7.5 MG tablet Take by mouth.    Marland Kitchen omeprazole (PRILOSEC) 20 MG capsule Take 20 mg by mouth daily.    Marland Kitchen omeprazole (PRILOSEC) 20 MG capsule Take by mouth.    Marland Kitchen omeprazole (PRILOSEC) 20 MG capsule Take 20 mg by mouth.    . simvastatin (ZOCOR) 40 MG tablet Take 40 mg by mouth daily.    . simvastatin (ZOCOR) 40 MG tablet Take by mouth.    .  simvastatin (ZOCOR) 40 MG tablet Take 20 mg by mouth.    . terazosin (HYTRIN) 10 MG capsule Take 10 mg by mouth at bedtime.    Marland Kitchen terazosin (HYTRIN) 10 MG capsule Take by mouth.    . terazosin (HYTRIN) 10 MG capsule Take 10 mg by mouth.     No current facility-administered medications on file prior to visit.     No Known Allergies  Objective: Physical Exam  General: Well developed, nourished, no acute distress, awake, alert and oriented x 3  Vascular: Dorsalis pedis artery 1/4 bilateral, Posterior tibial artery 2/4 bilateral, skin temperature warm to warm proximal to distal bilateral lower extremities, no varicosities, Scant pedal hair present bilateral.  Neurological: Gross sensation present via light touch bilateral. Subjective numbness, tingling, burning, bilateral.  Dermatological: Skin is warm, dry, and supple bilateral, Nails 1-10 are tender, long, thick, and discolored with mild subungal debris, no webspace macerations present bilateral, no open lesions present bilateral, no callus/corns/hyperkeratotic tissue present bilateral. No signs of infection bilateral.  Musculoskeletal: Asymptomatic hammertoe boney deformities noted bilateral. Muscular strength within normal limits without painon range of motion. No pain with calf compression bilateral.  Assessment and Plan:  Problem List Items Addressed This Visit    None    Visit Diagnoses    Onychomycosis    -  Primary   Pain of toe, unspecified laterality          -  Examined patient.  -Discussed treatment options for painful mycotic nails. -Mechanically debrided and reduced mycotic nails with sterile nail nipper and dremel nail file without incident. -Patient to return in 3 months for follow up evaluation or sooner if symptoms worsen.  Landis Martins, DPM

## 2016-12-30 ENCOUNTER — Ambulatory Visit (INDEPENDENT_AMBULATORY_CARE_PROVIDER_SITE_OTHER): Payer: Medicare Other | Admitting: Sports Medicine

## 2016-12-30 DIAGNOSIS — M79676 Pain in unspecified toe(s): Secondary | ICD-10-CM

## 2016-12-30 DIAGNOSIS — B351 Tinea unguium: Secondary | ICD-10-CM

## 2016-12-30 DIAGNOSIS — G629 Polyneuropathy, unspecified: Secondary | ICD-10-CM

## 2016-12-30 NOTE — Progress Notes (Signed)
Subjective: Erik Brock is a 83 y.o. male patient seen today in office with complaint of painful thickened and elongated toenails; unable to trim. Patient has neuropathy secondary to back and is still on Gabapentin. Patient has no other pedal complaints at this time.   Patient Active Problem List   Diagnosis Date Noted  . Chronic diastolic heart failure ([place]) 04/05/2015  . Dyslipidemia 04/05/2015  . Hypertensive heart disease with heart failure ([place]) 04/05/2015  . Artificial cardiac pacemaker 12/22/2014  . Atrioventricular block, Mobitz type 2 12/08/2014  . Arteriosclerosis of coronary artery 12/08/2014  . Block, bundle branch, left 12/08/2014  . Gonalgia 11/11/2014  . Neuritis or radiculitis due to rupture of lumbar intervertebral disc 11/11/2014  . Arthralgia of shoulder 10/14/2014  . Elevated prostate specific antigen (PSA) 07/30/2013  . Acid reflux 07/30/2013  . HLD (hyperlipidemia) 07/30/2013  . BP (high blood pressure) 07/30/2013  . Lumbar canal stenosis 07/30/2013    Current Outpatient Prescriptions on File Prior to Visit  Medication Sig Dispense Refill  . aspirin 325 MG tablet Take 325 mg by mouth daily.    Marland Kitchen aspirin 325 MG tablet Take 325 mg by mouth.    . finasteride (PROSCAR) 5 MG tablet Take 5 mg by mouth.    . gabapentin (NEURONTIN) 300 MG capsule Take 300 mg by mouth.    Marland Kitchen lisinopril (PRINIVIL,ZESTRIL) 20 MG tablet Take 20 mg by mouth daily.    Marland Kitchen lisinopril (PRINIVIL,ZESTRIL) 20 MG tablet Take by mouth.    Marland Kitchen lisinopril (PRINIVIL,ZESTRIL) 40 MG tablet Take 40 mg by mouth.    . meloxicam (MOBIC) 7.5 MG tablet Take by mouth.    Marland Kitchen omeprazole (PRILOSEC) 20 MG capsule Take 20 mg by mouth daily.    Marland Kitchen omeprazole (PRILOSEC) 20 MG capsule Take by mouth.    Marland Kitchen omeprazole (PRILOSEC) 20 MG capsule Take 20 mg by mouth.    . simvastatin (ZOCOR) 40 MG tablet Take 40 mg by mouth daily.    . simvastatin (ZOCOR) 40 MG tablet Take by mouth.    . simvastatin (ZOCOR) 40 MG tablet Take 20 mg  by mouth.    . terazosin (HYTRIN) 10 MG capsule Take 10 mg by mouth at bedtime.    Marland Kitchen terazosin (HYTRIN) 10 MG capsule Take by mouth.    . terazosin (HYTRIN) 10 MG capsule Take 10 mg by mouth.     No current facility-administered medications on file prior to visit.     No Known Allergies  Objective: Physical Exam  General: Well developed, nourished, no acute distress, awake, alert and oriented x 3  Vascular: Dorsalis pedis artery 1/4 bilateral, Posterior tibial artery 2/4 bilateral, skin temperature warm to warm proximal to distal bilateral lower extremities, no varicosities, Scant pedal hair present bilateral.  Neurological: Gross sensation present via light touch bilateral. Subjective numbness, tingling, burning, bilateral.  Dermatological: Skin is warm, dry, and supple bilateral, Nails 1-10 are tender, long, thick, and discolored with mild subungal debris, no webspace macerations present bilateral, no open lesions present bilateral, no callus/corns/hyperkeratotic tissue present bilateral. No signs of infection bilateral.  Musculoskeletal: Asymptomatic hammertoe boney deformities noted bilateral. Muscular strength within normal limits without painon range of motion. No pain with calf compression bilateral.  Assessment and Plan:  Problem List Items Addressed This Visit    None    Visit Diagnoses    Onychomycosis    -  Primary   Pain of toe, unspecified laterality       Neuropathy         -  Examined patient.  -Discussed treatment options for painful mycotic nails. -Mechanically debrided and reduced mycotic nails with sterile nail nipper and dremel nail file without incident. -Patient to return in 3 months for follow up evaluation or sooner if symptoms worsen.  Landis Martins, DPM

## 2017-04-05 ENCOUNTER — Ambulatory Visit (INDEPENDENT_AMBULATORY_CARE_PROVIDER_SITE_OTHER): Payer: Medicare Other | Admitting: Sports Medicine

## 2017-04-05 ENCOUNTER — Encounter (INDEPENDENT_AMBULATORY_CARE_PROVIDER_SITE_OTHER): Payer: Self-pay

## 2017-04-05 DIAGNOSIS — G629 Polyneuropathy, unspecified: Secondary | ICD-10-CM | POA: Diagnosis not present

## 2017-04-05 DIAGNOSIS — B351 Tinea unguium: Secondary | ICD-10-CM

## 2017-04-05 DIAGNOSIS — M79676 Pain in unspecified toe(s): Secondary | ICD-10-CM | POA: Diagnosis not present

## 2017-04-05 NOTE — Progress Notes (Signed)
Subjective: Erik Brock is a 83 y.o. male patient seen today in office with complaint of painful thickened and elongated toenails; unable to trim. Patient has neuropathy secondary to back and is still on Gabapentin as previous. Reports that he was diagnosed with skin cancer on head and is being treated by Dermatology. Patient has no other pedal complaints at this time.   Patient Active Problem List   Diagnosis Date Noted  . Chronic diastolic heart failure ([place]) 04/05/2015  . Dyslipidemia 04/05/2015  . Hypertensive heart disease with heart failure ([place]) 04/05/2015  . Artificial cardiac pacemaker 12/22/2014  . Atrioventricular block, Mobitz type 2 12/08/2014  . Arteriosclerosis of coronary artery 12/08/2014  . Block, bundle branch, left 12/08/2014  . Gonalgia 11/11/2014  . Neuritis or radiculitis due to rupture of lumbar intervertebral disc 11/11/2014  . Arthralgia of shoulder 10/14/2014  . Elevated prostate specific antigen (PSA) 07/30/2013  . Acid reflux 07/30/2013  . HLD (hyperlipidemia) 07/30/2013  . BP (high blood pressure) 07/30/2013  . Lumbar canal stenosis 07/30/2013    Current Outpatient Prescriptions on File Prior to Visit  Medication Sig Dispense Refill  . aspirin 325 MG tablet Take 325 mg by mouth daily.    Marland Kitchen aspirin 325 MG tablet Take 325 mg by mouth.    . finasteride (PROSCAR) 5 MG tablet Take 5 mg by mouth.    . gabapentin (NEURONTIN) 300 MG capsule Take 300 mg by mouth.    Marland Kitchen lisinopril (PRINIVIL,ZESTRIL) 20 MG tablet Take 20 mg by mouth daily.    Marland Kitchen lisinopril (PRINIVIL,ZESTRIL) 20 MG tablet Take by mouth.    Marland Kitchen lisinopril (PRINIVIL,ZESTRIL) 40 MG tablet Take 40 mg by mouth.    . meloxicam (MOBIC) 7.5 MG tablet Take by mouth.    Marland Kitchen omeprazole (PRILOSEC) 20 MG capsule Take 20 mg by mouth daily.    Marland Kitchen omeprazole (PRILOSEC) 20 MG capsule Take by mouth.    Marland Kitchen omeprazole (PRILOSEC) 20 MG capsule Take 20 mg by mouth.    . simvastatin (ZOCOR) 40 MG tablet Take 40 mg by mouth daily.     . simvastatin (ZOCOR) 40 MG tablet Take by mouth.    . simvastatin (ZOCOR) 40 MG tablet Take 20 mg by mouth.    . terazosin (HYTRIN) 10 MG capsule Take 10 mg by mouth at bedtime.    Marland Kitchen terazosin (HYTRIN) 10 MG capsule Take by mouth.    . terazosin (HYTRIN) 10 MG capsule Take 10 mg by mouth.     No current facility-administered medications on file prior to visit.     No Known Allergies  Objective: Physical Exam  General: Well developed, nourished, no acute distress, awake, alert and oriented x 3  Vascular: Dorsalis pedis artery 1/4 bilateral, Posterior tibial artery 2/4 bilateral, skin temperature warm to warm proximal to distal bilateral lower extremities, no varicosities, Scant pedal hair present bilateral.  Neurological: Gross sensation present via light touch bilateral. Subjective numbness, tingling, burning, bilateral.  Dermatological: Skin is warm, dry, and supple bilateral, Nails 1-10 are tender, long, thick, and discolored with mild subungal debris, no webspace macerations present bilateral, no open lesions present bilateral, no callus/corns/hyperkeratotic tissue present bilateral. No signs of infection bilateral.  Musculoskeletal: Asymptomatic hammertoe boney deformities noted bilateral. Muscular strength within normal limits without painon range of motion. No pain with calf compression bilateral.  Assessment and Plan:  Problem List Items Addressed This Visit    None    Visit Diagnoses    Onychomycosis    -  Primary   Pain of toe, unspecified laterality       Neuropathy         -Examined patient.  -Discussed treatment options for painful mycotic nails. -Mechanically debrided and reduced mycotic nails with sterile nail nipper and dremel nail file without incident. -Continue with Gabapentin as Rx by PCP -Patient to return in 3 months for follow up evaluation or sooner if symptoms worsen.  Landis Martins, DPM

## 2017-07-05 ENCOUNTER — Encounter: Payer: Self-pay | Admitting: Sports Medicine

## 2017-07-05 ENCOUNTER — Ambulatory Visit (INDEPENDENT_AMBULATORY_CARE_PROVIDER_SITE_OTHER): Payer: Medicare Other | Admitting: Sports Medicine

## 2017-07-05 DIAGNOSIS — B351 Tinea unguium: Secondary | ICD-10-CM | POA: Diagnosis not present

## 2017-07-05 DIAGNOSIS — M79676 Pain in unspecified toe(s): Secondary | ICD-10-CM | POA: Diagnosis not present

## 2017-07-05 DIAGNOSIS — G629 Polyneuropathy, unspecified: Secondary | ICD-10-CM

## 2017-07-05 NOTE — Progress Notes (Signed)
Subjective: Erik Brock is a 83 y.o. male patient seen today in office with complaint of painful thickened and elongated toenails; unable to trim. 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 Reports that he did over 40 session of PT with no improvement. Patient has no other pedal complaints at this time.   Patient Active Problem List   Diagnosis Date Noted  . Chronic diastolic heart failure ([place]) 04/05/2015  . Dyslipidemia 04/05/2015  . Hypertensive heart disease with heart failure ([place]) 04/05/2015  . Artificial cardiac pacemaker 12/22/2014  . Atrioventricular block, Mobitz type 2 12/08/2014  . Arteriosclerosis of coronary artery 12/08/2014  . Block, bundle branch, left 12/08/2014  . Gonalgia 11/11/2014  . Neuritis or radiculitis due to rupture of lumbar intervertebral disc 11/11/2014  . Arthralgia of shoulder 10/14/2014  . Elevated prostate specific antigen (PSA) 07/30/2013  . Acid reflux 07/30/2013  . HLD (hyperlipidemia) 07/30/2013  . BP (high blood pressure) 07/30/2013  . Lumbar canal stenosis 07/30/2013    Current Outpatient Medications on File Prior to Visit  Medication Sig Dispense Refill  . aspirin 325 MG tablet Take 325 mg by mouth daily.    Marland Kitchen aspirin 325 MG tablet Take 325 mg by mouth.    . finasteride (PROSCAR) 5 MG tablet Take 5 mg by mouth.    . gabapentin (NEURONTIN) 300 MG capsule Take 300 mg by mouth.    Marland Kitchen lisinopril (PRINIVIL,ZESTRIL) 20 MG tablet Take 20 mg by mouth daily.    Marland Kitchen lisinopril (PRINIVIL,ZESTRIL) 20 MG tablet Take by mouth.    Marland Kitchen lisinopril (PRINIVIL,ZESTRIL) 40 MG tablet Take 40 mg by mouth.    . meloxicam (MOBIC) 7.5 MG tablet Take by mouth.    Marland Kitchen omeprazole (PRILOSEC) 20 MG capsule Take 20 mg by mouth daily.    Marland Kitchen omeprazole (PRILOSEC) 20 MG capsule Take by mouth.    Marland Kitchen omeprazole (PRILOSEC) 20 MG capsule Take 20 mg by mouth.    . simvastatin (ZOCOR) 40 MG tablet Take 40 mg by mouth daily.    . simvastatin  (ZOCOR) 40 MG tablet Take by mouth.    . simvastatin (ZOCOR) 40 MG tablet Take 20 mg by mouth.    . terazosin (HYTRIN) 10 MG capsule Take 10 mg by mouth at bedtime.    Marland Kitchen terazosin (HYTRIN) 10 MG capsule Take by mouth.    . terazosin (HYTRIN) 10 MG capsule Take 10 mg by mouth.     No current facility-administered medications on file prior to visit.     No Known Allergies  Objective: Physical Exam  General: Well developed, nourished, no acute distress, awake, alert and oriented x 3  Vascular: Dorsalis pedis artery 1/4 bilateral, Posterior tibial artery 2/4 bilateral, skin temperature warm to warm proximal to distal bilateral lower extremities, no varicosities, Scant pedal hair present bilateral.  Neurological: Gross sensation present via light touch bilateral. Subjective numbness, tingling, burning, bilateral.  Dermatological: Skin is warm, dry, and supple bilateral, Nails 1-10 are tender, long, thick, and discolored with mild subungal debris, no webspace macerations present bilateral, no open lesions present bilateral, no callus/corns/hyperkeratotic tissue present bilateral. No signs of infection bilateral.  Musculoskeletal: Asymptomatic hammertoe boney deformities noted bilateral. Muscular strength within normal limits without painon range of motion. No pain with calf compression bilateral.  Assessment and Plan:  Problem List Items Addressed This Visit    None    Visit Diagnoses    Onychomycosis    -  Primary  Pain of toe, unspecified laterality       Neuropathy         -Examined patient.  -Discussed treatment options for painful mycotic nails. -Mechanically debrided and reduced mycotic nails with sterile nail nipper and dremel nail file without incident. -Continue with Gabapentin as Rx by PCP for neuropathy  -Patient to return in 3 months for follow up evaluation or sooner if symptoms worsen.  Landis Martins, DPM

## 2017-08-16 ENCOUNTER — Other Ambulatory Visit: Payer: Self-pay | Admitting: Student

## 2017-08-17 ENCOUNTER — Other Ambulatory Visit: Payer: Self-pay | Admitting: Student

## 2017-08-17 DIAGNOSIS — G8929 Other chronic pain: Secondary | ICD-10-CM

## 2017-08-17 DIAGNOSIS — M898X1 Other specified disorders of bone, shoulder: Principal | ICD-10-CM

## 2017-08-31 ENCOUNTER — Ambulatory Visit (HOSPITAL_COMMUNITY)
Admission: RE | Admit: 2017-08-31 | Discharge: 2017-08-31 | Disposition: A | Discharge status: Home / Self Care | Payer: Medicare Other | Source: Ambulatory Visit | Attending: Student | Admitting: Student

## 2017-08-31 ENCOUNTER — Encounter (HOSPITAL_COMMUNITY): Payer: Self-pay

## 2017-08-31 ENCOUNTER — Ambulatory Visit (HOSPITAL_COMMUNITY): Payer: Medicare Other

## 2017-08-31 DIAGNOSIS — M5033 Other cervical disc degeneration, cervicothoracic region: Secondary | ICD-10-CM | POA: Insufficient documentation

## 2017-08-31 DIAGNOSIS — I712 Thoracic aortic aneurysm, without rupture: Secondary | ICD-10-CM | POA: Insufficient documentation

## 2017-08-31 DIAGNOSIS — G8929 Other chronic pain: Secondary | ICD-10-CM | POA: Diagnosis not present

## 2017-08-31 DIAGNOSIS — M898X1 Other specified disorders of bone, shoulder: Secondary | ICD-10-CM

## 2017-08-31 DIAGNOSIS — M4802 Spinal stenosis, cervical region: Secondary | ICD-10-CM | POA: Insufficient documentation

## 2017-09-05 DIAGNOSIS — I7121 Aneurysm of the ascending aorta, without rupture: Secondary | ICD-10-CM

## 2017-09-05 DIAGNOSIS — I712 Thoracic aortic aneurysm, without rupture: Secondary | ICD-10-CM

## 2017-09-05 DIAGNOSIS — I429 Cardiomyopathy, unspecified: Secondary | ICD-10-CM

## 2017-09-05 HISTORY — DX: Cardiomyopathy, unspecified: I42.9

## 2017-09-05 HISTORY — DX: Thoracic aortic aneurysm, without rupture: I71.2

## 2017-09-05 HISTORY — DX: Aneurysm of the ascending aorta, without rupture: I71.21

## 2017-10-04 ENCOUNTER — Encounter: Payer: Self-pay | Admitting: Sports Medicine

## 2017-10-04 ENCOUNTER — Ambulatory Visit (INDEPENDENT_AMBULATORY_CARE_PROVIDER_SITE_OTHER): Payer: Medicare Other | Admitting: Sports Medicine

## 2017-10-04 DIAGNOSIS — M79676 Pain in unspecified toe(s): Secondary | ICD-10-CM

## 2017-10-04 DIAGNOSIS — G629 Polyneuropathy, unspecified: Secondary | ICD-10-CM

## 2017-10-04 DIAGNOSIS — B351 Tinea unguium: Secondary | ICD-10-CM

## 2017-10-04 NOTE — Progress Notes (Signed)
Subjective: Erik Brock is a 84 y.o. male patient seen today in office with complaint of painful thickened and elongated toenails; unable to trim. 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 Reports that he's in PT for right knee for a recent fall. Patient has no other pedal complaints at this time.   Patient Active Problem List   Diagnosis Date Noted  . Chronic diastolic heart failure ([place]) 04/05/2015  . Dyslipidemia 04/05/2015  . Hypertensive heart disease with heart failure ([place]) 04/05/2015  . Artificial cardiac pacemaker 12/22/2014  . Atrioventricular block, Mobitz type 2 12/08/2014  . Arteriosclerosis of coronary artery 12/08/2014  . Block, bundle branch, left 12/08/2014  . Gonalgia 11/11/2014  . Neuritis or radiculitis due to rupture of lumbar intervertebral disc 11/11/2014  . Arthralgia of shoulder 10/14/2014  . Elevated prostate specific antigen (PSA) 07/30/2013  . Acid reflux 07/30/2013  . HLD (hyperlipidemia) 07/30/2013  . BP (high blood pressure) 07/30/2013  . Lumbar canal stenosis 07/30/2013    Current Outpatient Medications on File Prior to Visit  Medication Sig Dispense Refill  . aspirin 325 MG tablet Take 325 mg by mouth daily.    Marland Kitchen aspirin 325 MG tablet Take 325 mg by mouth.    . finasteride (PROSCAR) 5 MG tablet Take 5 mg by mouth.    . gabapentin (NEURONTIN) 300 MG capsule Take 300 mg by mouth.    Marland Kitchen lisinopril (PRINIVIL,ZESTRIL) 20 MG tablet Take 20 mg by mouth daily.    Marland Kitchen lisinopril (PRINIVIL,ZESTRIL) 20 MG tablet Take by mouth.    Marland Kitchen lisinopril (PRINIVIL,ZESTRIL) 40 MG tablet Take 40 mg by mouth.    . meloxicam (MOBIC) 7.5 MG tablet Take by mouth.    Marland Kitchen omeprazole (PRILOSEC) 20 MG capsule Take 20 mg by mouth daily.    Marland Kitchen omeprazole (PRILOSEC) 20 MG capsule Take by mouth.    Marland Kitchen omeprazole (PRILOSEC) 20 MG capsule Take 20 mg by mouth.    . simvastatin (ZOCOR) 40 MG tablet Take 40 mg by mouth daily.    . simvastatin (ZOCOR)  40 MG tablet Take by mouth.    . simvastatin (ZOCOR) 40 MG tablet Take 20 mg by mouth.    . terazosin (HYTRIN) 10 MG capsule Take 10 mg by mouth at bedtime.    Marland Kitchen terazosin (HYTRIN) 10 MG capsule Take by mouth.    . terazosin (HYTRIN) 10 MG capsule Take 10 mg by mouth.     No current facility-administered medications on file prior to visit.     No Known Allergies  Objective: Physical Exam  General: Well developed, nourished, no acute distress, awake, alert and oriented x 3  Vascular: Dorsalis pedis artery 1/4 bilateral, Posterior tibial artery 1/4 bilateral, skin temperature warm to cool proximal to distal bilateral lower extremities, mild varicosities/discoloration, Scant pedal hair present bilateral.  Neurological: Gross sensation present via light touch bilateral. Subjective numbness, tingling, burning, bilateral.  Dermatological: Skin is warm, dry, and supple bilateral, Nails 1-10 are tender, long, thick, and discolored with mild subungal debris, no webspace macerations present bilateral, no open lesions present bilateral, no callus/corns/hyperkeratotic tissue present bilateral. No signs of infection bilateral.  Musculoskeletal: Asymptomatic hammertoe boney deformities noted bilateral. Muscular strength within normal limits without painon range of motion. No pain with calf compression bilateral.  Assessment and Plan:  Problem List Items Addressed This Visit    None    Visit Diagnoses    Onychomycosis    -  Primary  Pain of toe, unspecified laterality       Neuropathy         -Examined patient.  -Discussed treatment options for painful mycotic nails. -ABN signed  -Mechanically debrided and reduced mycotic nails with sterile nail nipper and dremel nail file without incident. -Continue with Gabapentin as Rx by PCP for neuropathy  -Patient to return in 3 months for follow up evaluation or sooner if symptoms worsen.  Landis Martins, DPM

## 2018-01-03 ENCOUNTER — Ambulatory Visit (INDEPENDENT_AMBULATORY_CARE_PROVIDER_SITE_OTHER): Payer: Medicare Other | Admitting: Sports Medicine

## 2018-01-03 ENCOUNTER — Encounter: Payer: Self-pay | Admitting: Sports Medicine

## 2018-01-03 DIAGNOSIS — D689 Coagulation defect, unspecified: Secondary | ICD-10-CM

## 2018-01-03 DIAGNOSIS — M79676 Pain in unspecified toe(s): Secondary | ICD-10-CM

## 2018-01-03 DIAGNOSIS — G629 Polyneuropathy, unspecified: Secondary | ICD-10-CM

## 2018-01-03 DIAGNOSIS — B351 Tinea unguium: Secondary | ICD-10-CM

## 2018-01-03 NOTE — Progress Notes (Signed)
Subjective: Erik Brock is a 84 y.o. male patient seen today in office with complaint of painful thickened and elongated toenails; unable to trim. 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 On Aspirin for his heart. Patient has no other pedal complaints at this time.   Patient Active Problem List   Diagnosis Date Noted  . Chronic diastolic heart failure ([place]) 04/05/2015  . Dyslipidemia 04/05/2015  . Hypertensive heart disease with heart failure ([place]) 04/05/2015  . Artificial cardiac pacemaker 12/22/2014  . Atrioventricular block, Mobitz type 2 12/08/2014  . Arteriosclerosis of coronary artery 12/08/2014  . Block, bundle branch, left 12/08/2014  . Gonalgia 11/11/2014  . Neuritis or radiculitis due to rupture of lumbar intervertebral disc 11/11/2014  . Arthralgia of shoulder 10/14/2014  . Elevated prostate specific antigen (PSA) 07/30/2013  . Acid reflux 07/30/2013  . HLD (hyperlipidemia) 07/30/2013  . BP (high blood pressure) 07/30/2013  . Lumbar canal stenosis 07/30/2013    Current Outpatient Medications on File Prior to Visit  Medication Sig Dispense Refill  . aspirin 325 MG tablet Take 325 mg by mouth daily.    Marland Kitchen aspirin 325 MG tablet Take 325 mg by mouth.    . finasteride (PROSCAR) 5 MG tablet Take 5 mg by mouth.    . gabapentin (NEURONTIN) 300 MG capsule Take 300 mg by mouth.    Marland Kitchen lisinopril (PRINIVIL,ZESTRIL) 20 MG tablet Take 20 mg by mouth daily.    Marland Kitchen lisinopril (PRINIVIL,ZESTRIL) 20 MG tablet Take by mouth.    Marland Kitchen lisinopril (PRINIVIL,ZESTRIL) 40 MG tablet Take 40 mg by mouth.    . meloxicam (MOBIC) 7.5 MG tablet Take by mouth.    Marland Kitchen omeprazole (PRILOSEC) 20 MG capsule Take 20 mg by mouth daily.    Marland Kitchen omeprazole (PRILOSEC) 20 MG capsule Take by mouth.    Marland Kitchen omeprazole (PRILOSEC) 20 MG capsule Take 20 mg by mouth.    . simvastatin (ZOCOR) 40 MG tablet Take 40 mg by mouth  daily.    . simvastatin (ZOCOR) 40 MG tablet Take by mouth.    . simvastatin (ZOCOR) 40 MG tablet Take 20 mg by mouth.    . terazosin (HYTRIN) 10 MG capsule Take 10 mg by mouth at bedtime.    Marland Kitchen terazosin (HYTRIN) 10 MG capsule Take by mouth.    . terazosin (HYTRIN) 10 MG capsule Take 10 mg by mouth.     No current facility-administered medications on file prior to visit.     No Known Allergies  Objective: Physical Exam  General: Well developed, nourished, no acute distress, awake, alert and oriented x 3  Vascular: Dorsalis pedis artery 1/4 bilateral, Posterior tibial artery 1/4 bilateral, skin temperature warm to cool proximal to distal bilateral lower extremities, mild varicosities/discoloration, Scant pedal hair present bilateral.  Neurological: Gross sensation present via light touch bilateral. Subjective numbness, tingling, burning, bilateral.  Dermatological: Skin is warm, dry, and supple bilateral, Nails 1-10 are tender, long, thick, and discolored with mild subungal debris, no webspace macerations present bilateral, no open lesions present bilateral, no callus/corns/hyperkeratotic tissue present bilateral. No signs of infection bilateral.  Musculoskeletal: Asymptomatic hammertoe boney deformities noted bilateral. Muscular strength within normal limits without pain on range of motion. No pain with calf compression bilateral.  Assessment and Plan:  Problem List Items Addressed This Visit    None    Visit Diagnoses    Onychomycosis    -  Primary   Pain of toe, unspecified laterality       Neuropathy       Coagulopathy ([place])         -Examined patient.  -Discussed treatment options for painful mycotic nails. -Mechanically debrided and reduced mycotic nails with sterile nail nipper and dremel nail file without incident. -Continue with Gabapentin as Rx by PCP for neuropathy; advised patient to continue to keep his PCP up to date on how its helping his neuropathy -Patient to  return in 3 months for follow up evaluation or sooner if symptoms worsen.  Landis Martins, DPM

## 2018-04-05 ENCOUNTER — Encounter: Payer: Self-pay | Admitting: Sports Medicine

## 2018-04-05 ENCOUNTER — Ambulatory Visit (INDEPENDENT_AMBULATORY_CARE_PROVIDER_SITE_OTHER): Payer: Medicare Other | Admitting: Sports Medicine

## 2018-04-05 VITALS — BP 140/82 | HR 83 | Resp 15

## 2018-04-05 DIAGNOSIS — M79676 Pain in unspecified toe(s): Secondary | ICD-10-CM | POA: Diagnosis not present

## 2018-04-05 DIAGNOSIS — B351 Tinea unguium: Secondary | ICD-10-CM | POA: Diagnosis not present

## 2018-04-05 DIAGNOSIS — D689 Coagulation defect, unspecified: Secondary | ICD-10-CM

## 2018-04-05 DIAGNOSIS — G629 Polyneuropathy, unspecified: Secondary | ICD-10-CM

## 2018-04-05 NOTE — Progress Notes (Signed)
Subjective: Erik Brock is a 84 y.o. male patient seen today in office with complaint of painful thickened and elongated toenails; unable to trim. 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 Patient has no other pedal complaints at this time.   Patient Active Problem List   Diagnosis Date Noted  . Chronic diastolic heart failure ([place]) 04/05/2015  . Dyslipidemia 04/05/2015  . Hypertensive heart disease with heart failure ([place]) 04/05/2015  . Artificial cardiac pacemaker 12/22/2014  . Atrioventricular block, Mobitz type 2 12/08/2014  . Arteriosclerosis of coronary artery 12/08/2014  . Block, bundle branch, left 12/08/2014  . Gonalgia 11/11/2014  . Neuritis or radiculitis due to rupture of lumbar intervertebral disc 11/11/2014  . Arthralgia of shoulder 10/14/2014  . Elevated prostate specific antigen (PSA) 07/30/2013  . Acid reflux 07/30/2013  . HLD (hyperlipidemia) 07/30/2013  . BP (high blood pressure) 07/30/2013  . Lumbar canal stenosis 07/30/2013    Current Outpatient Medications on File Prior to Visit  Medication Sig Dispense Refill  . aspirin 325 MG tablet Take 325 mg by mouth daily.    Marland Kitchen aspirin 325 MG tablet Take 325 mg by mouth.    . finasteride (PROSCAR) 5 MG tablet Take 5 mg by mouth.    . gabapentin (NEURONTIN) 300 MG capsule Take 300 mg by mouth.    Marland Kitchen lisinopril (PRINIVIL,ZESTRIL) 20 MG tablet Take 20 mg by mouth daily.    Marland Kitchen lisinopril (PRINIVIL,ZESTRIL) 20 MG tablet Take by mouth.    Marland Kitchen lisinopril (PRINIVIL,ZESTRIL) 40 MG tablet Take 40 mg by mouth.    . meloxicam (MOBIC) 7.5 MG tablet Take by mouth.    Marland Kitchen omeprazole (PRILOSEC) 20 MG capsule Take 20 mg by mouth daily.    Marland Kitchen omeprazole (PRILOSEC) 20 MG capsule Take by mouth.    Marland Kitchen omeprazole (PRILOSEC) 20 MG capsule Take 20 mg by mouth.    . simvastatin (ZOCOR) 40 MG tablet Take 40 mg by mouth daily.    . simvastatin (ZOCOR) 40 MG tablet Take  by mouth.    . simvastatin (ZOCOR) 40 MG tablet Take 20 mg by mouth.    . terazosin (HYTRIN) 10 MG capsule Take 10 mg by mouth at bedtime.    Marland Kitchen terazosin (HYTRIN) 10 MG capsule Take by mouth.    . terazosin (HYTRIN) 10 MG capsule Take 10 mg by mouth.     No current facility-administered medications on file prior to visit.     No Known Allergies  Objective: Physical Exam  General: Well developed, nourished, no acute distress, awake, alert and oriented x 3  Vascular: Dorsalis pedis artery 1/4 bilateral, Posterior tibial artery 0/4 bilateral due to trace edema ankles today, skin temperature warm to cool proximal to distal bilateral lower extremities, mild varicosities/discoloration, Scant pedal hair present bilateral.  Neurological: Gross sensation present via light touch bilateral. Subjective numbness, tingling, burning, bilateral.  Dermatological: Skin is warm, dry, and supple bilateral, Nails 1-10 are tender, long, thick, and discolored with mild subungal debris, no webspace macerations present bilateral, no open lesions present bilateral, no callus/corns/hyperkeratotic tissue present bilateral. No signs of infection bilateral.  Musculoskeletal: Asymptomatic hammertoe boney deformities noted bilateral. Muscular strength within normal limits without pain on range of motion. No pain with calf compression bilateral.  Assessment and Plan:  Problem List Items Addressed This Visit    None    Visit Diagnoses    Onychomycosis    -  Primary   Pain of toe, unspecified laterality       Neuropathy       Coagulopathy ([place])         -Examined patient.  -Discussed treatment options for painful mycotic nails. -Mechanically debrided and reduced mycotic nails with sterile nail nipper and dremel nail file without incident. -Patient to return in 3 months for follow up evaluation or sooner if symptoms worsen.  Landis Martins, DPM

## 2018-07-05 ENCOUNTER — Encounter: Payer: Self-pay | Admitting: Sports Medicine

## 2018-07-05 ENCOUNTER — Ambulatory Visit (INDEPENDENT_AMBULATORY_CARE_PROVIDER_SITE_OTHER): Payer: Medicare Other | Admitting: Sports Medicine

## 2018-07-05 VITALS — BP 113/62 | HR 91 | Resp 16

## 2018-07-05 DIAGNOSIS — G629 Polyneuropathy, unspecified: Secondary | ICD-10-CM | POA: Diagnosis not present

## 2018-07-05 DIAGNOSIS — D689 Coagulation defect, unspecified: Secondary | ICD-10-CM | POA: Diagnosis not present

## 2018-07-05 DIAGNOSIS — B351 Tinea unguium: Secondary | ICD-10-CM

## 2018-07-05 DIAGNOSIS — M79676 Pain in unspecified toe(s): Secondary | ICD-10-CM

## 2018-07-05 NOTE — Progress Notes (Signed)
Subjective: Erik Brock is a 84 y.o. male patient seen today in office with complaint of painful thickened and elongated toenails; unable to trim. 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  No new medications or changes since last visit however does state that he feels a little sick or under the weather today.  Patient has no other pedal complaints at this time.   Patient Active Problem List   Diagnosis Date Noted  . Chronic diastolic heart failure ([place]) 04/05/2015  . Dyslipidemia 04/05/2015  . Hypertensive heart disease with heart failure ([place]) 04/05/2015  . Artificial cardiac pacemaker 12/22/2014  . Atrioventricular block, Mobitz type 2 12/08/2014  . Arteriosclerosis of coronary artery 12/08/2014  . Block, bundle branch, left 12/08/2014  . Gonalgia 11/11/2014  . Neuritis or radiculitis due to rupture of lumbar intervertebral disc 11/11/2014  . Arthralgia of shoulder 10/14/2014  . Elevated prostate specific antigen (PSA) 07/30/2013  . Acid reflux 07/30/2013  . HLD (hyperlipidemia) 07/30/2013  . BP (high blood pressure) 07/30/2013  . Lumbar canal stenosis 07/30/2013    Current Outpatient Medications on File Prior to Visit  Medication Sig Dispense Refill  . aspirin 325 MG tablet Take 325 mg by mouth daily.    Marland Kitchen aspirin 325 MG tablet Take 325 mg by mouth.    . finasteride (PROSCAR) 5 MG tablet Take 5 mg by mouth.    . gabapentin (NEURONTIN) 300 MG capsule Take 300 mg by mouth.    Marland Kitchen lisinopril (PRINIVIL,ZESTRIL) 20 MG tablet Take 20 mg by mouth daily.    Marland Kitchen lisinopril (PRINIVIL,ZESTRIL) 20 MG tablet Take by mouth.    Marland Kitchen lisinopril (PRINIVIL,ZESTRIL) 40 MG tablet Take 40 mg by mouth.    . meloxicam (MOBIC) 7.5 MG tablet Take by mouth.    Marland Kitchen omeprazole (PRILOSEC) 20 MG capsule Take 20 mg by mouth daily.    Marland Kitchen omeprazole (PRILOSEC) 20 MG capsule Take by mouth.    Marland Kitchen omeprazole (PRILOSEC) 20 MG capsule Take 20  mg by mouth.    . simvastatin (ZOCOR) 40 MG tablet Take 40 mg by mouth daily.    . simvastatin (ZOCOR) 40 MG tablet Take by mouth.    . simvastatin (ZOCOR) 40 MG tablet Take 20 mg by mouth.    . terazosin (HYTRIN) 10 MG capsule Take 10 mg by mouth at bedtime.    Marland Kitchen terazosin (HYTRIN) 10 MG capsule Take by mouth.    . terazosin (HYTRIN) 10 MG capsule Take 10 mg by mouth.     No current facility-administered medications on file prior to visit.     No Known Allergies  Objective: Physical Exam  General: Well developed, nourished, no acute distress, awake, alert and oriented x 3  Vascular: Dorsalis pedis artery 1/4 bilateral, Posterior tibial artery 0/4 bilateral due to trace edema ankles today, skin temperature warm to cool proximal to distal bilateral lower extremities, mild varicosities/discoloration, Scant pedal hair present bilateral.  Neurological: Gross sensation present via light touch bilateral. Subjective numbness, tingling, burning, bilateral.  Dermatological: Skin is warm, dry, and supple bilateral, Nails 1-10 are tender, long, thick, and discolored with mild subungal debris, no webspace macerations present bilateral, no open lesions present bilateral, no callus/corns/hyperkeratotic tissue present bilateral. No signs of infection bilateral.  Musculoskeletal: Asymptomatic hammertoe boney deformities noted bilateral. Muscular strength within normal limits without pain on range of motion. No pain with calf compression bilateral.  Assessment and Plan:  Problem List Items Addressed This Visit    None    Visit Diagnoses    Onychomycosis    -  Primary   Pain of toe, unspecified laterality       Neuropathy       Coagulopathy ([place])         -Examined patient.  -Discussed treatment options for painful mycotic nails. -Mechanically debrided and reduced mycotic nails with sterile nail nipper and dremel nail file without incident. -Patient to return in 3 months for follow up evaluation  or sooner if symptoms worsen.  Landis Martins, DPM

## 2018-08-20 NOTE — Progress Notes (Signed)
Cardiology Office Note:    Date:  08/21/2018   ID:  Erik Brock, DOB Mar 16, 1928, MRN [id number]  PCP:  Lenard Simmer, MD  Cardiologist:  Shirlee More, MD    Referring MD: Lenard Simmer, MD    ASSESSMENT:    1. Chronic diastolic heart failure ([place])   2. Hypertensive heart disease with heart failure ([facility])   3. Cardiac pacemaker in situ   4. Ascending aortic aneurysm ([place])   5. Coronary artery disease of native artery of native heart with stable angina pectoris ([place])    PLAN:    In order of problems listed above:  1. Stable compensated New York Heart Association class I at this time does not require loop diuretic 2. Stable blood pressure continue current treatment ACE inhibitor.  He will have labs with his PCP in the next few weeks 3. Stable will refer to EP and transition to device clinic in our practice 4. Mild we will discuss repeat evaluation at his next visit 5. Stable CAD continue medical treatment including aspirin reduced to 81 mg daily and his statin.  New York Heart Association class I at this time does not need ischemia evaluation   Next appointment: 6 months   Medication Adjustments/Labs and Tests Ordered: Current medicines are reviewed at length with the patient today.  Concerns regarding medicines are outlined above.  Orders Placed This Encounter  Procedures  . Ambulatory referral to Cardiac Electrophysiology  . EKG 12-Lead   No orders of the defined types were placed in this encounter.   Chief Complaint  Patient presents with  . Establish Care    History of Present Illness:    Erik Brock is a 85 y.o. male with a hx of hypertensive heart disease chronic diastolic heart failure mild coronary artery disease heart block and dual-chamber pacemaker last seen by me at [facility] cardiology 10/27/2016.  In the interim his device has been followed in that practice.  He is seen by me today to reestablish cardiology care.  His pacemaker is a dual-chamber  Medtronic advisa.  Other problems include ascending aortic aneurysm 4.1 cm on MRI 08/31/2017, left bundle branch block hyperlipidemia and cardiomyopathy with most recent ejection fraction 50 to 55% by echo 09/19/2017 Compliance with diet, lifestyle and medications: Yes  Overall is done well he is just increasingly frail he has had falls at home and his son now has a alert device that his father wears on his wrist.  He has had skin cancers and removals.  He has had no chest pain edema orthopnea shortness of breath palpitation or syncope.  He is supervised and compliant with medications.  Labs are followed with his PCP and has an appointment in the next few weeks Past Medical History:  Diagnosis Date  . Ascending aortic aneurysm ([place]) 09/05/2017  . 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 Bleeding nose   . CAD (coronary artery disease) 12/08/2014   Overview:  Mild, nonobsructive  Overview:  Mild, nonobsructive  . Cardiac pacemaker in situ 12/22/2014  . Cardiomyopathy ([place]) 09/05/2017  . Chronic diastolic heart failure ([place]) 04/05/2015  . Dyslipidemia 04/05/2015  . Elevated PSA 07/30/2013  . Essential (primary) hypertension 07/30/2013  . GERD (gastroesophageal reflux disease) 07/30/2013  . Gonalgia 11/11/2014  . Hyperlipidemia 07/30/2013  . Hypertension   . Hypertensive heart disease with heart failure ([place]) 04/05/2015  . LBBB (left bundle branch block) 12/08/2014  . Lumbar stenosis 07/30/2013  . Neuritis or radiculitis due to rupture of lumbar  intervertebral disc 11/11/2014  . Pacemaker reprogramming/check 12/22/2014  . Thyroid disease   . Trigger point of right shoulder region 10/14/2014    Past Surgical History:  Procedure Laterality Date  . APPENDECTOMY    . INSERT / REPLACE / REMOVE PACEMAKER    . tonsills      Current Medications: Current Meds  Medication Sig  . aspirin 325 MG tablet Take 325 mg by mouth. Half tablet daily  . finasteride (PROSCAR) 5 MG tablet Take 5 mg by mouth daily.   Marland Kitchen  levothyroxine (SYNTHROID, LEVOTHROID) 50 MCG tablet Take 50 mcg by mouth daily before breakfast.  . lisinopril (PRINIVIL,ZESTRIL) 40 MG tablet Take 40 mg by mouth.  Marland Kitchen omeprazole (PRILOSEC) 20 MG capsule Take 20 mg by mouth daily.  . simvastatin (ZOCOR) 40 MG tablet Take by mouth daily.   Marland Kitchen terazosin (HYTRIN) 10 MG capsule Take 10 mg by mouth daily.      Allergies:   Patient has no known allergies.   Social History   Socioeconomic History  . Marital status: Widowed    Spouse name: Not on file  . Number of children: Not on file  . Years of education: Not on file  . Highest education level: Not on file  Occupational History  . Not on file  Social Needs  . Financial resource strain: Not on file  . Food insecurity:    Worry: Not on file    Inability: Not on file  . Transportation needs:    Medical: Not on file    Non-medical: Not on file  Tobacco Use  . Smoking status: Never Smoker  . Smokeless tobacco: Never Used  Substance and Sexual Activity  . Alcohol use: No  . Drug use: No  . Sexual activity: Not on file  Lifestyle  . Physical activity:    Days per week: Not on file    Minutes per session: Not on file  . Stress: Not on file  Relationships  . Social connections:    Talks on phone: Not on file    Gets together: Not on file    Attends religious service: Not on file    Active member of club or organization: Not on file    Attends meetings of clubs or organizations: Not on file    Relationship status: Not on file  Other Topics Concern  . Not on file  Social History Narrative  . Not on file     Family History: The patient's family history includes Brain cancer in his father. ROS:   Please see the history of present illness.    All other systems reviewed and are negative.  EKGs/Labs/Other Studies Reviewed:    The following studies were reviewed today:  EKG:  EKG ordered today.  The ekg ordered today demonstrates atrial paced left bundle branch block  Recent  Labs: No results found for requested labs within last 8760 hours.  Recent Lipid Panel No results found for: CHOL, TRIG, HDL, CHOLHDL, VLDL, LDLCALC, LDLDIRECT  Physical Exam:    VS:  BP 128/70 (BP Location: Right Arm, Patient Position: Sitting, Cuff Size: Normal)   Pulse 76   Ht 5\' 7"  (1.702 m)   Wt 180 lb 6 oz (81.8 kg)   SpO2 98%   BMI 28.25 kg/m     Wt Readings from Last 3 Encounters:  08/21/18 180 lb 6 oz (81.8 kg)  06/27/16 180 lb (81.6 kg)     GEN:  Well nourished, well developed in no  acute distress HEENT: Normal NECK: No JVD; No carotid bruits LYMPHATICS: No lymphadenopathy CARDIAC: RRR, no murmurs, rubs, gallops RESPIRATORY:  Clear to auscultation without rales, wheezing or rhonchi  ABDOMEN: Soft, non-tender, non-distended MUSCULOSKELETAL:  No edema; No deformity  SKIN: Warm and dry NEUROLOGIC:  Alert and oriented x 3 PSYCHIATRIC:  Normal affect    Signed, Shirlee More, MD  08/21/2018 1:02 PM    [place]

## 2018-08-21 ENCOUNTER — Ambulatory Visit (INDEPENDENT_AMBULATORY_CARE_PROVIDER_SITE_OTHER): Payer: Medicare Other | Admitting: Cardiology

## 2018-08-21 ENCOUNTER — Encounter: Payer: Self-pay | Admitting: Cardiology

## 2018-08-21 VITALS — BP 128/70 | HR 76 | Ht 67.0 in | Wt 180.4 lb

## 2018-08-21 DIAGNOSIS — I25118 Atherosclerotic heart disease of native coronary artery with other forms of angina pectoris: Secondary | ICD-10-CM

## 2018-08-21 DIAGNOSIS — I712 Thoracic aortic aneurysm, without rupture: Secondary | ICD-10-CM | POA: Diagnosis not present

## 2018-08-21 DIAGNOSIS — I110 Hypertensive heart disease with heart failure: Secondary | ICD-10-CM

## 2018-08-21 DIAGNOSIS — I7121 Aneurysm of the ascending aorta, without rupture: Secondary | ICD-10-CM

## 2018-08-21 DIAGNOSIS — I5032 Chronic diastolic (congestive) heart failure: Secondary | ICD-10-CM | POA: Diagnosis not present

## 2018-08-21 DIAGNOSIS — Z950 Presence of cardiac pacemaker: Secondary | ICD-10-CM | POA: Diagnosis not present

## 2018-08-21 NOTE — Patient Instructions (Addendum)
Medication Instructions:  Your physician recommends that you continue on your current medications as directed. Please refer to the Current Medication list given to you today.   If you need a refill on your cardiac medications before your next appointment, please call your pharmacy.   Lab work: None  If you have labs (blood work) drawn today and your tests are completely normal, you will receive your results only by: Marland Kitchen MyChart Message (if you have MyChart) OR . A paper copy in the mail If you have any lab test that is abnormal or we need to change your treatment, we will call you to review the results.  Testing/Procedures: You had an EKG today.  Follow-Up: At [facility], you and your health needs are our priority.  As part of our continuing mission to provide you with exceptional heart care, we have created designated Provider Care Teams.  These Care Teams include your primary Cardiologist (physician) and Advanced Practice Providers (APPs -  Physician Assistants and Nurse Practitioners) who all work together to provide you with the care you need, when you need it. You will need a follow up appointment in 6 months.  Please call our office 2 months in advance to schedule this appointment.     You will see Dr Curt Bears in [place] office for follow up regarding pacemaker.

## 2018-10-04 ENCOUNTER — Ambulatory Visit: Payer: Medicare Other | Admitting: Sports Medicine

## 2018-10-17 ENCOUNTER — Telehealth: Payer: Self-pay | Admitting: *Deleted

## 2018-10-17 NOTE — Telephone Encounter (Signed)
Called patient to let them know due to recent [place] and Health Department Protocols, we are not seeing patients in the office. We are instead seeing if they would like to schedule this appointment as a Research scientist (medical) or Laptop. Patient is aware if they decide to reschedule this appointment, they may not be seen or scheduled for the next 4-6 months. Patient at this time declines WebEx Virtual Visits. Patient does not have computer, internet, or smart phone access. Message sent scheduling and nurse.   Patient has no recent or current CP, chest discomfort, SHOB, swelling, or fatigue.  Patient was reassured his device is still being followed by his previous Dr until we are able to transfer care to Dr. Curt Bears.

## 2018-10-18 NOTE — Telephone Encounter (Signed)
Pt is unable to do a webex visit as he has no smart phone/computer/internet. Pt made aware that we will request his device be transferred into our clinic.  Once this has been completed we will call him to send in a remote transmission and then arrange a telephone visit with Dr. Curt Bears to establish device care.  Device info: PPM-MDT Model # [id number] Serial # [id number]  Remote box: Model # [id number] Serial # [id number]  Will forward to our device clinic asking them to request device transfer to our clinic. Once completed they can let me know and I will arrange visit with physician.

## 2018-10-18 NOTE — Telephone Encounter (Signed)
Patient enrolled in remote monitoring for [facility] heartcare clinic.

## 2018-10-19 NOTE — Telephone Encounter (Signed)
Last remote 1/22, next remote scheduled for 4/24.  Chanetta Marshall, NP 10/19/2018 9:22 AM

## 2018-10-25 ENCOUNTER — Ambulatory Visit: Payer: Medicare Other | Admitting: Sports Medicine

## 2018-10-26 ENCOUNTER — Telehealth: Payer: Self-pay | Admitting: Cardiology

## 2018-10-26 NOTE — Telephone Encounter (Signed)
Transmission received.

## 2018-10-29 ENCOUNTER — Encounter: Payer: Medicare Other | Admitting: Cardiology

## 2018-11-01 ENCOUNTER — Encounter: Payer: Self-pay | Admitting: Cardiology

## 2018-11-01 ENCOUNTER — Telehealth (INDEPENDENT_AMBULATORY_CARE_PROVIDER_SITE_OTHER): Payer: Medicare Other | Admitting: Cardiology

## 2018-11-01 ENCOUNTER — Other Ambulatory Visit: Payer: Self-pay

## 2018-11-01 ENCOUNTER — Telehealth: Payer: Self-pay | Admitting: *Deleted

## 2018-11-01 DIAGNOSIS — I441 Atrioventricular block, second degree: Secondary | ICD-10-CM

## 2018-11-01 NOTE — Progress Notes (Signed)
Electrophysiology TeleHealth Note   Due to national recommendations of social distancing due to COVID 19, an audio/video telehealth visit is felt to be most appropriate for this patient at this time.  See See epic message for the patient's consent to telehealth with [facility] heart care.   Date:  11/01/2018   ID:  Erik Brock, DOB 1927-08-13, MRN [id number]  Location: patient's home  Provider location: [address] [place]  Evaluation Performed: Follow-up visit  PCP:  Lenard Simmer, MD  Cardiologist:  [facility]  Electrophysiologist:  Dr Curt Bears  Chief Complaint: Pacemaker  History of Present Illness:    Erik Brock is a 85 y.o. male who presents via audio/video conferencing for a telehealth visit today.  Since last being seen in our clinic, the patient reports doing very well.  Today, he denies symptoms of palpitations, chest pain, shortness of breath,  lower extremity edema, dizziness, presyncope, or syncope.  The patient is otherwise without complaint today.  The patient denies symptoms of fevers, chills, cough, or new SOB worrisome for COVID 19.  Patient was referred by Dr. Bettina Gavia for pacemaker care and evaluation.He has a history of chronic diastolic heart failure, hypertension, ascending aortic aneurysm, and coronary artery disease.  He also has a history of Mobitz 2 AV block and thus has a Medtronic dual-chamber pacemaker implanted 12/09/2014.  Today, denies symptoms of palpitations, chest pain, shortness of breath, orthopnea, PND, lower extremity edema, claudication, dizziness, presyncope, syncope, bleeding, or neurologic sequela. The patient is tolerating medications without difficulties.    Past Medical History:  Diagnosis Date  . Ascending aortic aneurysm ([place]) 09/05/2017  . 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 Bleeding nose   . CAD (coronary artery disease) 12/08/2014   Overview:  Mild, nonobsructive  Overview:  Mild, nonobsructive  . Cardiac pacemaker in situ  12/22/2014  . Cardiomyopathy ([place]) 09/05/2017  . Chronic diastolic heart failure ([place]) 04/05/2015  . Dyslipidemia 04/05/2015  . Elevated PSA 07/30/2013  . Essential (primary) hypertension 07/30/2013  . GERD (gastroesophageal reflux disease) 07/30/2013  . Gonalgia 11/11/2014  . Hyperlipidemia 07/30/2013  . Hypertension   . Hypertensive heart disease with heart failure ([place]) 04/05/2015  . LBBB (left bundle branch block) 12/08/2014  . Lumbar stenosis 07/30/2013  . Neuritis or radiculitis due to rupture of lumbar intervertebral disc 11/11/2014  . Pacemaker reprogramming/check 12/22/2014  . Thyroid disease   . Trigger point of right shoulder region 10/14/2014    Past Surgical History:  Procedure Laterality Date  . APPENDECTOMY    . INSERT / REPLACE / REMOVE PACEMAKER    . tonsills      Current Outpatient Medications  Medication Sig Dispense Refill  . aspirin 325 MG tablet Take 325 mg by mouth. Half tablet daily    . finasteride (PROSCAR) 5 MG tablet Take 5 mg by mouth daily.     Marland Kitchen levothyroxine (SYNTHROID, LEVOTHROID) 50 MCG tablet Take 50 mcg by mouth daily before breakfast.    . lisinopril (PRINIVIL,ZESTRIL) 40 MG tablet Take 40 mg by mouth.    Marland Kitchen omeprazole (PRILOSEC) 20 MG capsule Take 20 mg by mouth daily.    . simvastatin (ZOCOR) 40 MG tablet Take by mouth daily.     Marland Kitchen terazosin (HYTRIN) 10 MG capsule Take 10 mg by mouth daily.      No current facility-administered medications for this visit.     Allergies:   Patient has no known allergies.   Social History:  The patient  reports that he has never smoked. He has never used smokeless tobacco. He reports that he does not drink alcohol or use drugs.   Family History:  The patient's  family history includes Brain cancer in his father.   ROS:  Please see the history of present illness.   All other systems are personally reviewed and negative.    Exam:    Vital Signs:  There were no vitals taken for this visit.   Over the phone, no  shortness of breath, no acute distress   Labs/Other Tests and Data Reviewed:    Recent Labs: No results found for requested labs within last 8760 hours.   Wt Readings from Last 3 Encounters:  08/21/18 180 lb 6 oz (81.8 kg)  06/27/16 180 lb (81.6 kg)     Other studies personally reviewed: Additional studies/ records that were reviewed today include: TTE  09/19/17 Review of the above records today demonstrates:   Left ventricle cavity size is normal. Mild concentric left ventricular hypertrophy Low normal LV systolic function with asynchronous contraction pattern of the LV due to IVCD/pacemaker Ejection fraction is visually estimated at 50-55% Moderate Mitral valve thickening. Trace mitral regurgitation. Aortic valve sclerosis without stenosis Aortic root dimension within normal limits.    Last device remote is reviewed from [place] PDF dated 10/26/2018 which reveals normal device function, no arrhythmias    ASSESSMENT & PLAN:    1.  Mobitz 2 AV block: Status post Medtronic dual-chamber pacemaker implanted 12/05/2014.  Pacemaker functioning appropriately on most recent device check.  No changes.  2.  Hypertension: Has been well controlled at his 27 offices.  He has not checked his blood pressure at home.  No changes.  3.  Chronic diastolic heart failure: No obvious signs of volume overload  4.  Coronary artery disease: Currently on aspirin and statin.  Plan per primary cardiology.  5.  Hyperlipidemia: Continue statin.   COVID 19 screen The patient denies symptoms of COVID 19 at this time.  The importance of social distancing was discussed today.  Follow-up: 1 year  Current medicines are reviewed at length with the patient today.   The patient does not have concerns regarding his medicines.  The following changes were made today:  none  Labs/ tests ordered today include:  No orders of the defined types were placed in this encounter.  The patient does not have  a smart phone nor access to a computer.  This visit with US done over the telephone.  Patient Risk:  after full review of this patients clinical status, I feel that they are at moderate risk at this time.  Today, I have spent 13 minutes with the patient with telehealth technology discussing pacemaker .    Signed, Ruslan Mccabe Meredith Leeds, MD  11/01/2018 11:13 AM     [facility] [address] [place] [address] [phone] (office) [phone] (fax)

## 2018-11-01 NOTE — Telephone Encounter (Signed)
Virtual Visit Pre-Appointment Phone Call  Steps For Call:  1. Confirm consent - "In the setting of the current Covid19 crisis, you are scheduled for a (phone or video) visit with your provider on (date) at (time).  Just as we do with many in-office visits, in order for you to participate in this visit, we must obtain consent.  If you'd like, I can send this to your mychart (if signed up) or email for you to review.  Otherwise, I can obtain your verbal consent now.  All virtual visits are billed to your insurance company just like a normal visit would be.  By agreeing to a virtual visit, we'd like you to understand that the technology does not allow for your provider to perform an examination, and thus may limit your provider's ability to fully assess your condition.  Finally, though the technology is pretty good, we cannot assure that it will always work on either your or our end, and in the setting of a video visit, we may have to convert it to a phone-only visit.  In either situation, we cannot ensure that we have a secure connection.  Are you willing to proceed?" STAFF: Did the patient verbally acknowledge consent to telehealth visit? Document YES/NO here: YES  2. Confirm the BEST phone number to call the day of the visit by including in appointment notes  3. Give patient instructions for WebEx/MyChart download to smartphone as below or Doximity/Doxy.me if video visit (depending on what platform provider is using)  4. Advise patient to be prepared with their blood pressure, heart rate, weight, any heart rhythm information, their current medicines, and a piece of paper and pen handy for any instructions they may receive the day of their visit  5. Inform patient they will receive a phone call 15 minutes prior to their appointment time (may be from unknown caller ID) so they should be prepared to answer  6. Confirm that appointment type is correct in Epic appointment notes (VIDEO vs PHONE)      TELEPHONE CALL NOTE  Erik Brock has been deemed a candidate for a follow-up tele-health visit to limit community exposure during the Covid-19 pandemic. I spoke with the patient via phone to ensure availability of phone/video source, confirm preferred email & phone number, and discuss instructions and expectations.  I reminded Erik Brock to be prepared with any vital sign and/or heart rhythm information that could potentially be obtained via home monitoring, at the time of his visit. I reminded Erik Brock to expect a phone call at the time of his visit if his visit.  Erik Kidney, RN 11/01/2018 9:43 AM   INSTRUCTIONS FOR DOWNLOADING THE WEBEX APP TO SMARTPHONE  - If Apple, ask patient to go to App Store and type in WebEx in the search bar. [place] [employer], the blue/green circle. If Android, go to [employer] and type in [employer] in the search bar. The app is free but as with any other app downloads, their phone may require them to verify saved payment information or Apple/Android password.  - The patient does NOT have to create an account. - On the day of the visit, the assist will walk the patient through joining the meeting with the meeting number/password.  INSTRUCTIONS FOR DOWNLOADING THE MYCHART APP TO SMARTPHONE  - The patient must first make sure to have activated MyChart and know their login information - If Apple, go to [employer] and type in MyChart in  the search bar and download the app. If Android, ask patient to go to [employer] and type in [place] in the search bar and download the app. The app is free but as with any other app downloads, their phone may require them to verify saved payment information or Apple/Android password.  - The patient will need to then log into the app with their MyChart username and password, and select [facility] as their healthcare provider to link the account. When it is time for your visit, go to the MyChart app, find  appointments, and click Begin Video Visit. Be sure to Select Allow for your device to access the Microphone and Camera for your visit. You will then be connected, and your provider will be with you shortly.  **If they have any issues connecting, or need assistance please contact MyChart service desk [phone])**  **If using a computer, in order to ensure the best quality for their visit they will need to use either of the following Internet Browsers: [employer], or Google Chrome**  IF USING DOXIMITY or DOXY.ME - The patient will receive a link just prior to their visit, either by text or email (to be determined day of appointment depending on if it's doxy.me or Doximity).     FULL LENGTH CONSENT FOR TELE-HEALTH VISIT   I hereby voluntarily request, consent and authorize [place] and its employed or contracted physicians, physician assistants, nurse practitioners or other licensed health care professionals (the Practitioner), to provide me with telemedicine health care services (the "Services") as deemed necessary by the treating Practitioner. I acknowledge and consent to receive the Services by the Practitioner via telemedicine. I understand that the telemedicine visit will involve communicating with the Practitioner through live audiovisual communication technology and the disclosure of certain medical information by electronic transmission. I acknowledge that I have been given the opportunity to request an in-person assessment or other available alternative prior to the telemedicine visit and am voluntarily participating in the telemedicine visit.  I understand that I have the right to withhold or withdraw my consent to the use of telemedicine in the course of my care at any time, without affecting my right to future care or treatment, and that the Practitioner or I may terminate the telemedicine visit at any time. I understand that I have the right to inspect all  information obtained and/or recorded in the course of the telemedicine visit and may receive copies of available information for a reasonable fee.  I understand that some of the potential risks of receiving the Services via telemedicine include:  Marland Kitchen Delay or interruption in medical evaluation due to technological equipment failure or disruption; . Information transmitted may not be sufficient (e.g. poor resolution of images) to allow for appropriate medical decision making by the Practitioner; and/or  . In rare instances, security protocols could fail, causing a breach of personal health information.  Furthermore, I acknowledge that it is my responsibility to provide information about my medical history, conditions and care that is complete and accurate to the best of my ability. I acknowledge that Practitioner's advice, recommendations, and/or decision may be based on factors not within their control, such as incomplete or inaccurate data provided by me or distortions of diagnostic images or specimens that may result from electronic transmissions. I understand that the practice of medicine is not an exact science and that Practitioner makes no warranties or guarantees regarding treatment outcomes. I acknowledge that I will receive a copy of this  consent concurrently upon execution via email to the email address I last provided but may also request a printed copy by calling the office of [place].    I understand that my insurance will be billed for this visit.   I have read or had this consent read to me. . I understand the contents of this consent, which adequately explains the benefits and risks of the Services being provided via telemedicine.  . I have been provided ample opportunity to ask questions regarding this consent and the Services and have had my questions answered to my satisfaction. . I give my informed consent for the services to be provided through the use of telemedicine in my  medical care  By participating in this telemedicine visit I agree to the above.

## 2018-11-09 ENCOUNTER — Other Ambulatory Visit: Payer: Self-pay

## 2018-11-09 ENCOUNTER — Telehealth: Payer: Self-pay

## 2018-11-09 ENCOUNTER — Ambulatory Visit (INDEPENDENT_AMBULATORY_CARE_PROVIDER_SITE_OTHER): Payer: Medicare Other | Admitting: *Deleted

## 2018-11-09 DIAGNOSIS — I441 Atrioventricular block, second degree: Secondary | ICD-10-CM | POA: Diagnosis not present

## 2018-11-09 NOTE — Telephone Encounter (Signed)
Spoke with patient to remind of missed remote transmission 

## 2018-11-10 LAB — CUP PACEART REMOTE DEVICE CHECK: Date Time Interrogation Session: 20200425111042

## 2018-11-16 ENCOUNTER — Encounter: Payer: Self-pay | Admitting: Cardiology

## 2018-11-16 NOTE — Progress Notes (Signed)
Remote pacemaker transmission.   

## 2018-12-07 ENCOUNTER — Ambulatory Visit: Payer: Medicare Other | Admitting: Sports Medicine

## 2018-12-19 ENCOUNTER — Ambulatory Visit (INDEPENDENT_AMBULATORY_CARE_PROVIDER_SITE_OTHER): Payer: Medicare Other | Admitting: Sports Medicine

## 2018-12-19 ENCOUNTER — Other Ambulatory Visit: Payer: Self-pay

## 2018-12-19 ENCOUNTER — Encounter: Payer: Self-pay | Admitting: Sports Medicine

## 2018-12-19 VITALS — Temp 98.6°F | Resp 16

## 2018-12-19 DIAGNOSIS — B351 Tinea unguium: Secondary | ICD-10-CM

## 2018-12-19 DIAGNOSIS — D689 Coagulation defect, unspecified: Secondary | ICD-10-CM

## 2018-12-19 DIAGNOSIS — M79676 Pain in unspecified toe(s): Secondary | ICD-10-CM | POA: Diagnosis not present

## 2018-12-19 DIAGNOSIS — G629 Polyneuropathy, unspecified: Secondary | ICD-10-CM

## 2018-12-19 NOTE — Progress Notes (Signed)
Subjective: Erik Brock is a 85 y.o. male patient seen today in office with complaint of painful thickened and elongated toenails; unable to trim. 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  No new medications or changes since last visit.  Patient has no other pedal complaints at this time.   Patient Active Problem List   Diagnosis Date Noted  . Ascending aortic aneurysm ([place]) 09/05/2017  . Cardiomyopathy ([place]) 09/05/2017  . Chronic diastolic heart failure ([place]) 04/05/2015  . Dyslipidemia 04/05/2015  . Hypertensive heart disease with heart failure ([place]) 04/05/2015  . Cardiac pacemaker in situ 12/22/2014  . Pacemaker reprogramming/check 12/22/2014  . 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 CAD (coronary artery disease) 12/08/2014  . LBBB (left bundle branch block) 12/08/2014  . Gonalgia 11/11/2014  . Neuritis or radiculitis due to rupture of lumbar intervertebral disc 11/11/2014  . Trigger point of right shoulder region 10/14/2014  . Elevated PSA 07/30/2013  . GERD (gastroesophageal reflux disease) 07/30/2013  . Hyperlipidemia 07/30/2013  . Essential (primary) hypertension 07/30/2013  . Lumbar stenosis 07/30/2013    Current Outpatient Medications on File Prior to Visit  Medication Sig Dispense Refill  . aspirin 325 MG tablet Take 325 mg by mouth. Half tablet daily    . finasteride (PROSCAR) 5 MG tablet Take 5 mg by mouth daily.     Marland Kitchen gabapentin (NEURONTIN) 300 MG capsule Take 300 mg by mouth at bedtime.    Marland Kitchen levothyroxine (SYNTHROID, LEVOTHROID) 50 MCG tablet Take 50 mcg by mouth daily before breakfast.    . lisinopril (PRINIVIL,ZESTRIL) 40 MG tablet Take 40 mg by mouth.    Marland Kitchen lisinopril-hydrochlorothiazide (ZESTORETIC) 20-25 MG tablet Take 1 tablet by mouth 2 (two) times daily.    Marland Kitchen omeprazole (PRILOSEC) 20 MG capsule Take 20 mg by mouth daily.    . simvastatin (ZOCOR) 40 MG tablet Take by mouth daily.      Marland Kitchen terazosin (HYTRIN) 10 MG capsule Take 10 mg by mouth daily.      No current facility-administered medications on file prior to visit.     No Known Allergies  Objective: Physical Exam  General: Well developed, nourished, no acute distress, awake, alert and oriented x 3  Vascular: Dorsalis pedis artery 1/4 bilateral, Posterior tibial artery 0/4 bilateral due to trace edema ankles today, skin temperature warm to cool proximal to distal bilateral lower extremities, mild varicosities/discoloration, Scant pedal hair present bilateral.  Neurological: Gross sensation present via light touch bilateral. Subjective numbness, tingling, burning, bilateral.  Dermatological: Skin is warm, dry, and supple bilateral, Nails 1-10 are tender, long, thick, and discolored with mild subungal debris, no webspace macerations present bilateral, no open lesions present bilateral, no callus/corns/hyperkeratotic tissue present bilateral. No signs of infection bilateral.  Musculoskeletal: Asymptomatic hammertoe boney deformities noted bilateral. Muscular strength within normal limits without pain on range of motion. No pain with calf compression bilateral.  Assessment and Plan:  Problem List Items Addressed This Visit    None    Visit Diagnoses    Onychomycosis    -  Primary   Pain of toe, unspecified laterality       Neuropathy       Coagulopathy ([place])         -Examined patient.  -Discussed treatment options for painful mycotic nails. -Mechanically debrided and reduced mycotic nails with sterile nail nipper and dremel nail file without incident. -Continue with good supportive shoes  for foot type -Patient to return in 3 months for follow up evaluation or sooner if symptoms worsen.  Landis Martins, DPM

## 2019-02-08 ENCOUNTER — Ambulatory Visit: Payer: Medicare Other | Admitting: *Deleted

## 2019-02-11 ENCOUNTER — Telehealth: Payer: Self-pay

## 2019-02-11 LAB — CUP PACEART REMOTE DEVICE CHECK
Battery Remaining Longevity: 74 mo
Battery Voltage: 3.01 V
Brady Statistic AP VP Percent: 73.7 %
Brady Statistic AP VS Percent: 15.56 %
Brady Statistic AS VP Percent: 5.91 %
Brady Statistic AS VS Percent: 4.83 %
Brady Statistic RA Percent Paced: 87.96 %
Brady Statistic RV Percent Paced: 78.85 %
Date Time Interrogation Session: 20200727145925
Lead Channel Impedance Value: 380 Ohm
Lead Channel Impedance Value: 456 Ohm
Lead Channel Impedance Value: 494 Ohm
Lead Channel Impedance Value: 551 Ohm
Lead Channel Pacing Threshold Amplitude: 0.75 V
Lead Channel Pacing Threshold Amplitude: 0.75 V
Lead Channel Pacing Threshold Pulse Width: 0.4 ms
Lead Channel Pacing Threshold Pulse Width: 0.4 ms
Lead Channel Sensing Intrinsic Amplitude: 1.125 mV
Lead Channel Sensing Intrinsic Amplitude: 1.125 mV
Lead Channel Sensing Intrinsic Amplitude: 14.875 mV
Lead Channel Sensing Intrinsic Amplitude: 14.875 mV
Lead Channel Setting Pacing Amplitude: 1.5 V
Lead Channel Setting Pacing Amplitude: 2 V
Lead Channel Setting Pacing Pulse Width: 0.4 ms
Lead Channel Setting Sensing Sensitivity: 0.9 mV

## 2019-02-11 NOTE — Telephone Encounter (Signed)
Spoke with patient to remind of missed remote transmission 

## 2019-02-15 ENCOUNTER — Telehealth: Payer: Self-pay | Admitting: *Deleted

## 2019-02-15 MED ORDER — METOPROLOL TARTRATE 25 MG PO TABS: 25.0000 mg | ORAL_TABLET | Freq: Two times a day (BID) | ORAL | 3 refills | Status: DC

## 2019-02-15 NOTE — Telephone Encounter (Signed)
-----   Message from Will Meredith Leeds, MD sent at 02/12/2019 11:49 AM EDT ----- Abnormal device interrogation reviewed.  Lead parameters and battery status stable.  NSVT noted. Start metoprolol 25 mg BID.

## 2019-02-15 NOTE — Telephone Encounter (Signed)
Pt has been notified of device check recommendations. Pt agreeable to start Metoprolol Tart 25 mg BID. Rx has been sent in. Pt thanked me for the call. The patient has been notified of the result and verbalized understanding.  All questions (if any) were answered. Julaine Hua, [facility] 02/15/2019 4:56 PM

## 2019-02-18 ENCOUNTER — Encounter: Payer: Self-pay | Admitting: Cardiology

## 2019-02-18 ENCOUNTER — Ambulatory Visit (INDEPENDENT_AMBULATORY_CARE_PROVIDER_SITE_OTHER): Payer: Medicare Other | Admitting: Cardiology

## 2019-02-18 ENCOUNTER — Other Ambulatory Visit: Payer: Self-pay

## 2019-02-18 VITALS — BP 140/80 | HR 88 | Temp 98.2°F | Ht 67.0 in | Wt 180.0 lb

## 2019-02-18 DIAGNOSIS — I7121 Aneurysm of the ascending aorta, without rupture: Secondary | ICD-10-CM

## 2019-02-18 DIAGNOSIS — M5134 Other intervertebral disc degeneration, thoracic region: Secondary | ICD-10-CM

## 2019-02-18 DIAGNOSIS — I2510 Atherosclerotic heart disease of native coronary artery without angina pectoris: Secondary | ICD-10-CM | POA: Diagnosis not present

## 2019-02-18 DIAGNOSIS — I5032 Chronic diastolic (congestive) heart failure: Secondary | ICD-10-CM

## 2019-02-18 DIAGNOSIS — I493 Ventricular premature depolarization: Secondary | ICD-10-CM

## 2019-02-18 DIAGNOSIS — I110 Hypertensive heart disease with heart failure: Secondary | ICD-10-CM

## 2019-02-18 DIAGNOSIS — Z950 Presence of cardiac pacemaker: Secondary | ICD-10-CM | POA: Diagnosis not present

## 2019-02-18 DIAGNOSIS — I712 Thoracic aortic aneurysm, without rupture: Secondary | ICD-10-CM

## 2019-02-18 MED ORDER — LISINOPRIL-HYDROCHLOROTHIAZIDE 20-25 MG PO TABS: 1.0000 | ORAL_TABLET | Freq: Two times a day (BID) | ORAL | 1 refills | Status: DC

## 2019-02-18 NOTE — Patient Instructions (Addendum)
Medication Instructions:  Your physician recommends that you continue on your current medications as directed. Please refer to the Current Medication list given to you today.  If you need a refill on your cardiac medications before your next appointment, please call your pharmacy.   Lab work: None If you have labs (blood work) drawn today and your tests are completely normal, you will receive your results only by: Marland Kitchen MyChart Message (if you have MyChart) OR . A paper copy in the mail If you have any lab test that is abnormal or we need to change your treatment, we will call you to review the results.  Testing/Procedures: None  Follow-Up: At [facility], you and your health needs are our priority.  As part of our continuing mission to provide you with exceptional heart care, we have created designated Provider Care Teams.  These Care Teams include your primary Cardiologist (physician) and Advanced Practice Providers (APPs -  Physician Assistants and Nurse Practitioners) who all work together to provide you with the care you need, when you need it. You will need a follow up appointment in 6 months with Dr. Bettina Gavia Any Other Special Instructions Will Be Listed Below (If Applicable).   YOU ARE BEING REFERRED TO DR. Junius Creamer FOR YOU DEGENERATIVE [place] DISEASE

## 2019-02-18 NOTE — Progress Notes (Signed)
Cardiology Office Note:    Date:  02/18/2019   ID:  Erik Brock, DOB [birth year], MRN [id number]  PCP:  Erik Simmer, MD  Cardiologist:  Erik More, MD    Referring MD: Erik Simmer, MD    ASSESSMENT:    1. Hypertensive heart disease with heart failure (Erik Brock)   2. Chronic diastolic heart failure (Erik Brock)   3. Mild CAD   4. Cardiac pacemaker in situ   5. Ascending aortic aneurysm (Erik Brock)   6. PVC's (premature ventricular contractions)    PLAN:    In order of problems listed above:  1. Stable hypertension continue current treatment including his thiazide diuretic and ACE inhibitor. 2. Stable hypertension no evidence of fluid overload presently not on a diuretic. 3. Stable CAD continue medical treatment aspirin and statin 4. Stable pacemaker function followed in our practice 5. Stable on recent CT at this time I would not repeat in the near future 6. He has a paucity of ventricular arrhythmia the family does not want to start another medication I think it is reasonable we will check his potassium and see if hypokalemia is a precipitant 7. Back pain with multiple level thoracic spine disc disease refer to Dr. Leda Brock for consideration of epidural   Next appointment: 6 months   Medication Adjustments/Labs and Tests Ordered: Current medicines are reviewed at length with the patient today.  Concerns regarding medicines are outlined above.  No orders of the defined types were placed in this encounter.  No orders of the defined types were placed in this encounter.   Chief Complaint  Patient presents with  . Follow-up    for pacemaker,  PVC's  . Congestive Heart Failure  . Coronary Artery Disease  . Hypertension    History of Present Illness:    Erik Brock is a 85 y.o. male with a hx of  hypertensive heart disease chronic diastolic heart failure mild coronary artery disease heart block and dual-chamber pacemaker. His pacemaker is a dual-chamber Medtronic advisa.  Other  problems include ascending aortic aneurysm 4.1 cm on MRI 08/31/2017, left bundle branch block hyperlipidemia and cardiomyopathy with most recent ejection fraction 50 to 55% by echo 09/19/2017  He was last seen 08/21/2018.His last device check showed a 9 beat run of PVC's and started on a beat blocker.He is RV paced 80%. Compliance with diet, lifestyle and medications: Yes  From a cardiology perspective he is doing well his weight is stable no edema shortness of breath chest pain palpitation or syncope.  He never started the beta-blocker his last labs were in December of last year and is seeing his PCP next week I will ask him to check labs including CMP proBNP and lipid profile.  At this time I would not initiate a beta-blocker unless he has ongoing runs of PVCs.  His primary complaint is chronic back pain he has multiple level disc disease thoracic spine and I will refer him to Erik Brock to see if epidural would be beneficial. Past Medical History:  Diagnosis Date  . Ascending aortic aneurysm ([place]) 09/05/2017  . 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 Bleeding nose   . CAD (coronary artery disease) 12/08/2014   Overview:  Mild, nonobsructive  Overview:  Mild, nonobsructive  . Cardiac pacemaker in situ 12/22/2014  . Cardiomyopathy ([place]) 09/05/2017  . Chronic diastolic heart failure ([place]) 04/05/2015  . Dyslipidemia 04/05/2015  . Elevated PSA 07/30/2013  . Essential (primary) hypertension 07/30/2013  . GERD (gastroesophageal reflux disease) 07/30/2013  .  Gonalgia 11/11/2014  . Hyperlipidemia 07/30/2013  . Hypertension   . Hypertensive heart disease with heart failure ([place]) 04/05/2015  . LBBB (left bundle branch block) 12/08/2014  . Lumbar stenosis 07/30/2013  . Neuritis or radiculitis due to rupture of lumbar intervertebral disc 11/11/2014  . Pacemaker reprogramming/check 12/22/2014  . Thyroid disease   . Trigger point of right shoulder region 10/14/2014    Past Surgical History:  Procedure Laterality Date  .  APPENDECTOMY    . INSERT / REPLACE / REMOVE PACEMAKER    . tonsills      Current Medications: Current Meds  Medication Sig  . aspirin 325 MG tablet Take 325 mg by mouth. Half tablet daily  . finasteride (PROSCAR) 5 MG tablet Take 5 mg by mouth daily.   Marland Kitchen gabapentin (NEURONTIN) 300 MG capsule Take 300 mg by mouth at bedtime.  Marland Kitchen levothyroxine (SYNTHROID, LEVOTHROID) 50 MCG tablet Take 50 mcg by mouth daily before breakfast.  . lisinopril-hydrochlorothiazide (ZESTORETIC) 20-25 MG tablet Take 1 tablet by mouth 2 (two) times daily.  . metoprolol tartrate (LOPRESSOR) 25 MG tablet Take 1 tablet (25 mg total) by mouth 2 (two) times daily.  Marland Kitchen omeprazole (PRILOSEC) 20 MG capsule Take 20 mg by mouth daily.  . simvastatin (ZOCOR) 20 MG tablet Take 20 mg by mouth daily.  Marland Kitchen terazosin (HYTRIN) 10 MG capsule Take 10 mg by mouth daily.   . Vitamin D, Ergocalciferol, (DRISDOL) 1.25 MG (50000 UT) CAPS capsule Take 50,000 Units by mouth once a week.  . [DISCONTINUED] lisinopril (PRINIVIL,ZESTRIL) 40 MG tablet Take 40 mg by mouth.     Allergies:   Patient has no known allergies.   Social History   Socioeconomic History  . Marital status: Widowed    Spouse name: Not on file  . Number of children: Not on file  . Years of education: Not on file  . Highest education level: Not on file  Occupational History  . Not on file  Social Needs  . Financial resource strain: Not on file  . Food insecurity    Worry: Not on file    Inability: Not on file  . Transportation needs    Medical: Not on file    Non-medical: Not on file  Tobacco Use  . Smoking status: Never Smoker  . Smokeless tobacco: Never Used  Substance and Sexual Activity  . Alcohol use: No  . Drug use: No  . Sexual activity: Not on file  Lifestyle  . Physical activity    Days per week: Not on file    Minutes per session: Not on file  . Stress: Not on file  Relationships  . Social Herbalist on phone: Not on file    Gets  together: Not on file    Attends religious service: Not on file    Active member of club or organization: Not on file    Attends meetings of clubs or organizations: Not on file    Relationship status: Not on file  Other Topics Concern  . Not on file  Social History Narrative  . Not on file     Family History: The patient's family history includes Brain cancer in his father. ROS:   Please see the history of present illness.    All other systems reviewed and are negative.  EKGs/Labs/Other Studies Reviewed:    The following studies were reviewed today: 02/11/2019:   Result Report  Battery Remaining Longevity: 74 mo     Battery  Status: OK       Battery Voltage: 3.01 V     Brady Statistic AP VP Percent: 73.7 %      Brady Statistic AP VS Percent: 15.56 %     Brady Statistic AS VP Percent: 5.91 %      Brady Statistic AS VS Percent: 4.83 %     Brady Statistic RA Percent Paced: 87.96 %      Brady Statistic RV Percent Paced: 78.85 %     Clinic Name: [facility]       Date Time Interrogation Session: [id number]      Implantable Pulse Generator Type: Implantable Pulse Generator       Lead Channel Impedance Value: 456 ohm     Lead Channel Impedance Value: 380 ohm      Lead Channel Impedance Value: 551 ohm     Lead Channel Impedance Value: 494 ohm      Lead Channel Pacing Threshold Amplitude: 0.75 V     Lead Channel Pacing Threshold Amplitude: 0.75 V      Lead Channel Pacing Threshold Pulse Width: 0.4 ms     Lead Channel Pacing Threshold Pulse Width: 0.4 ms      Lead Channel Sensing Intrinsic Amplitude: 1.125 mV     Lead Channel Sensing Intrinsic Amplitude: 1.125 mV      Lead Channel Sensing Intrinsic Amplitude: 14.875 mV     Lead Channel Sensing Intrinsic Amplitude: 14.875 mV      Lead Channel Setting Pacing Amplitude: 1.5 V     Lead Channel Setting Pacing Amplitude: 2 V      Lead Channel Setting Pacing Pulse Width: 0.4 ms     Lead  Channel Setting Sensing Sensitivity: 0.9 mV      Pulse Gen Model: [id number] Advisa DR MRI      Pulse Gen Serial Number: [id number]       Pulse Generator Manufacturer: MERM          Recent Labs: No results found for requested labs within last 8760 hours.  Recent Lipid Panel No results found for: CHOL, TRIG, HDL, CHOLHDL, VLDL, LDLCALC, LDLDIRECT  Physical Exam:    VS:  BP 140/80 (BP Location: Left Arm, Patient Position: Sitting, Cuff Size: Normal)   Pulse 88   Temp 98.2 F (36.8 C)   Ht 5\' 7"  (1.702 m)   Wt 180 lb (81.6 kg)   SpO2 97%   BMI 28.19 kg/m     Wt Readings from Last 3 Encounters:  02/18/19 180 lb (81.6 kg)  08/21/18 180 lb 6 oz (81.8 kg)  06/27/16 180 lb (81.6 kg)     GEN:  Well nourished, well developed in no acute distress HEENT: Normal NECK: No JVD; No carotid bruits LYMPHATICS: No lymphadenopathy CARDIAC: RRR, no murmurs, rubs, gallops RESPIRATORY:  Clear to auscultation without rales, wheezing or rhonchi  ABDOMEN: Soft, non-tender, non-distended MUSCULOSKELETAL:  No edema; No deformity  SKIN: Warm and dry NEUROLOGIC:  Alert and oriented x 3 PSYCHIATRIC:  Normal affect    Signed, Erik More, MD  02/18/2019 9:42 AM    [place]

## 2019-03-21 ENCOUNTER — Encounter: Payer: Self-pay | Admitting: Sports Medicine

## 2019-03-21 ENCOUNTER — Ambulatory Visit (INDEPENDENT_AMBULATORY_CARE_PROVIDER_SITE_OTHER): Payer: Medicare Other | Admitting: Sports Medicine

## 2019-03-21 ENCOUNTER — Other Ambulatory Visit: Payer: Self-pay

## 2019-03-21 VITALS — BP 121/57 | HR 97 | Temp 97.8°F | Resp 16

## 2019-03-21 DIAGNOSIS — M79676 Pain in unspecified toe(s): Secondary | ICD-10-CM

## 2019-03-21 DIAGNOSIS — B351 Tinea unguium: Secondary | ICD-10-CM | POA: Diagnosis not present

## 2019-03-21 DIAGNOSIS — G629 Polyneuropathy, unspecified: Secondary | ICD-10-CM

## 2019-03-21 DIAGNOSIS — D689 Coagulation defect, unspecified: Secondary | ICD-10-CM

## 2019-03-21 NOTE — Progress Notes (Signed)
Subjective: Erik Brock is a 85 y.o. male patient seen today in office with complaint of painful thickened and elongated toenails; unable to trim. 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  No new medications or changes since last visit.  Patient has no other pedal complaints at this time.   Patient Active Problem List   Diagnosis Date Noted  . Ascending aortic aneurysm ([place]) 09/05/2017  . Cardiomyopathy ([place]) 09/05/2017  . Chronic diastolic heart failure ([place]) 04/05/2015  . Dyslipidemia 04/05/2015  . Hypertensive heart disease with heart failure ([place]) 04/05/2015  . Cardiac pacemaker in situ 12/22/2014  . Pacemaker reprogramming/check 12/22/2014  . 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 CAD (coronary artery disease) 12/08/2014  . LBBB (left bundle branch block) 12/08/2014  . Gonalgia 11/11/2014  . Neuritis or radiculitis due to rupture of lumbar intervertebral disc 11/11/2014  . Trigger point of right shoulder region 10/14/2014  . Elevated PSA 07/30/2013  . GERD (gastroesophageal reflux disease) 07/30/2013  . Hyperlipidemia 07/30/2013  . Essential (primary) hypertension 07/30/2013  . Lumbar stenosis 07/30/2013    Current Outpatient Medications on File Prior to Visit  Medication Sig Dispense Refill  . aspirin 325 MG tablet Take 325 mg by mouth. Half tablet daily    . finasteride (PROSCAR) 5 MG tablet Take 5 mg by mouth daily.     Marland Kitchen gabapentin (NEURONTIN) 300 MG capsule Take 300 mg by mouth at bedtime.    Marland Kitchen levothyroxine (SYNTHROID, LEVOTHROID) 50 MCG tablet Take 50 mcg by mouth daily before breakfast.    . lisinopril-hydrochlorothiazide (ZESTORETIC) 20-25 MG tablet Take 1 tablet by mouth 2 (two) times daily. 90 tablet 1  . metoprolol tartrate (LOPRESSOR) 25 MG tablet Take 1 tablet (25 mg total) by mouth 2 (two) times daily. 180 tablet 3  . omeprazole (PRILOSEC) 20 MG capsule Take 20 mg by mouth daily.     . simvastatin (ZOCOR) 20 MG tablet Take 20 mg by mouth daily.    Marland Kitchen terazosin (HYTRIN) 10 MG capsule Take 10 mg by mouth daily.     . Vitamin D, Ergocalciferol, (DRISDOL) 1.25 MG (50000 UT) CAPS capsule Take 50,000 Units by mouth once a week.     No current facility-administered medications on file prior to visit.     No Known Allergies  Objective: Physical Exam  General: Well developed, nourished, no acute distress, awake, alert and oriented x 3  Vascular: Dorsalis pedis artery 1/4 bilateral, Posterior tibial artery 0/4 bilateral due to trace edema ankles today, skin temperature warm to cool proximal to distal bilateral lower extremities, mild varicosities/discoloration, Scant pedal hair present bilateral.  Neurological: Gross sensation present via light touch bilateral. Subjective numbness, tingling, burning, bilateral.  Dermatological: Skin is warm, dry, and supple bilateral, Nails 1-10 are tender, long, thick, and discolored with mild subungal debris, no webspace macerations present bilateral, no open lesions present bilateral, no callus/corns/hyperkeratotic tissue present bilateral. No signs of infection bilateral.  Musculoskeletal: Asymptomatic hammertoe boney deformities noted bilateral. Muscular strength within normal limits without pain on range of motion. No pain with calf compression bilateral.  Assessment and Plan:  Problem List Items Addressed This Visit    None    Visit Diagnoses    Onychomycosis    -  Primary   Pain of toe, unspecified laterality       Neuropathy       Coagulopathy ([place])         -  Examined patient.  -Discussed treatment options for painful mycotic nails. -Mechanically debrided and reduced mycotic nails with sterile nail nipper and dremel nail file without incident. -Patient to return in 3 months for follow up evaluation or sooner if symptoms worsen.  Landis Martins, DPM

## 2019-04-12 ENCOUNTER — Telehealth: Payer: Self-pay | Admitting: Cardiology

## 2019-04-12 NOTE — Telephone Encounter (Signed)
Telephone call to patient. States hasn't heard anything regarding Dr Laurelyn Sickle lab work that was supposed to be sent over a month ago. Informed him that Dr. Bettina Gavia is away and will review them on Monday.Hulen Skains Dr Laurelyn Sickle office to get labs fax'd to [phone].

## 2019-04-12 NOTE — Telephone Encounter (Signed)
States [facility] wanted blood work from Dr Humphrey Rolls but has not heard anything about it

## 2019-04-15 ENCOUNTER — Encounter: Payer: Self-pay | Admitting: Cardiology

## 2019-04-23 NOTE — Telephone Encounter (Signed)
Spoke with Dr. Laurelyn Sickle RN, Caryl Pina, who reports patient's most recent lab results are from 12/2018. Provided Caryl Pina with our fax number and she will send them over for Dr. Bettina Gavia to review.

## 2019-04-24 ENCOUNTER — Encounter: Payer: Self-pay | Admitting: Cardiology

## 2019-04-25 NOTE — Telephone Encounter (Signed)
I am pleased with the results no changes

## 2019-04-25 NOTE — Telephone Encounter (Signed)
Please review recent lab results that have been scanned into patient's chart and advise. Thanks!

## 2019-04-25 NOTE — Telephone Encounter (Signed)
Patient advised that Dr Bettina Gavia has reviewed his lab results and he is pleased with the results.  Patient advised that Dr Bettina Gavia does not wish to make any changes at this time.  Patient agreed to plan and verbalized understanding.  No further questions.

## 2019-05-15 ENCOUNTER — Ambulatory Visit (INDEPENDENT_AMBULATORY_CARE_PROVIDER_SITE_OTHER): Payer: Medicare Other | Admitting: *Deleted

## 2019-05-15 DIAGNOSIS — I447 Left bundle-branch block, unspecified: Secondary | ICD-10-CM

## 2019-05-15 DIAGNOSIS — I429 Cardiomyopathy, unspecified: Secondary | ICD-10-CM | POA: Diagnosis not present

## 2019-05-15 LAB — CUP PACEART REMOTE DEVICE CHECK
Battery Remaining Longevity: 65 mo
Battery Voltage: 3 V
Brady Statistic AP VP Percent: 72.72 %
Brady Statistic AP VS Percent: 15.5 %
Brady Statistic AS VP Percent: 6.73 %
Brady Statistic AS VS Percent: 5.05 %
Brady Statistic RA Percent Paced: 87.43 %
Brady Statistic RV Percent Paced: 79.01 %
Date Time Interrogation Session: 20201027210259
Lead Channel Impedance Value: 399 Ohm
Lead Channel Impedance Value: 475 Ohm
Lead Channel Impedance Value: 494 Ohm
Lead Channel Impedance Value: 570 Ohm
Lead Channel Pacing Threshold Amplitude: 0.75 V
Lead Channel Pacing Threshold Amplitude: 0.875 V
Lead Channel Pacing Threshold Pulse Width: 0.4 ms
Lead Channel Pacing Threshold Pulse Width: 0.4 ms
Lead Channel Sensing Intrinsic Amplitude: 1.25 mV
Lead Channel Sensing Intrinsic Amplitude: 1.25 mV
Lead Channel Sensing Intrinsic Amplitude: 19.875 mV
Lead Channel Sensing Intrinsic Amplitude: 19.875 mV
Lead Channel Setting Pacing Amplitude: 1.5 V
Lead Channel Setting Pacing Amplitude: 2 V
Lead Channel Setting Pacing Pulse Width: 0.4 ms
Lead Channel Setting Sensing Sensitivity: 0.9 mV

## 2019-05-29 NOTE — Progress Notes (Addendum)
Remote pacemaker transmission.   

## 2019-06-10 DIAGNOSIS — M47814 Spondylosis without myelopathy or radiculopathy, thoracic region: Secondary | ICD-10-CM | POA: Insufficient documentation

## 2019-06-20 ENCOUNTER — Ambulatory Visit: Payer: Medicare Other | Admitting: Sports Medicine

## 2019-07-04 ENCOUNTER — Encounter: Payer: Self-pay | Admitting: Sports Medicine

## 2019-07-04 ENCOUNTER — Other Ambulatory Visit: Payer: Self-pay

## 2019-07-04 ENCOUNTER — Ambulatory Visit (INDEPENDENT_AMBULATORY_CARE_PROVIDER_SITE_OTHER): Payer: Medicare Other | Admitting: Sports Medicine

## 2019-07-04 DIAGNOSIS — D689 Coagulation defect, unspecified: Secondary | ICD-10-CM | POA: Diagnosis not present

## 2019-07-04 DIAGNOSIS — M79676 Pain in unspecified toe(s): Secondary | ICD-10-CM

## 2019-07-04 DIAGNOSIS — B351 Tinea unguium: Secondary | ICD-10-CM | POA: Diagnosis not present

## 2019-07-04 DIAGNOSIS — G629 Polyneuropathy, unspecified: Secondary | ICD-10-CM

## 2019-07-04 NOTE — Progress Notes (Signed)
Subjective: Erik Brock is a 85 y.o. male patient seen today in office with complaint of painful thickened and elongated toenails; unable to trim. 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  Reports that he had a shot today for his back.  Patient has no other pedal complaints at this time.   Patient Active Problem List   Diagnosis Date Noted  . Ascending aortic aneurysm ([place]) 09/05/2017  . Cardiomyopathy ([place]) 09/05/2017  . Chronic diastolic heart failure ([place]) 04/05/2015  . Dyslipidemia 04/05/2015  . Hypertensive heart disease with heart failure ([place]) 04/05/2015  . Cardiac pacemaker in situ 12/22/2014  . Pacemaker reprogramming/check 12/22/2014  . 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 CAD (coronary artery disease) 12/08/2014  . LBBB (left bundle branch block) 12/08/2014  . Gonalgia 11/11/2014  . Neuritis or radiculitis due to rupture of lumbar intervertebral disc 11/11/2014  . Trigger point of right shoulder region 10/14/2014  . Elevated PSA 07/30/2013  . GERD (gastroesophageal reflux disease) 07/30/2013  . Hyperlipidemia 07/30/2013  . Essential (primary) hypertension 07/30/2013  . Lumbar stenosis 07/30/2013    Current Outpatient Medications on File Prior to Visit  Medication Sig Dispense Refill  . alendronate (FOSAMAX) 70 MG tablet TAKE 1 TABLET BY MOUTH ONCE A WEEK    . aspirin 325 MG tablet Take 325 mg by mouth. Half tablet daily    . finasteride (PROSCAR) 5 MG tablet Take 5 mg by mouth daily.     Marland Kitchen gabapentin (NEURONTIN) 300 MG capsule Take 300 mg by mouth at bedtime.    Marland Kitchen levothyroxine (SYNTHROID, LEVOTHROID) 50 MCG tablet Take 50 mcg by mouth daily before breakfast.    . lisinopril-hydrochlorothiazide (ZESTORETIC) 20-25 MG tablet Take 1 tablet by mouth 2 (two) times daily. 90 tablet 1  . metoprolol tartrate (LOPRESSOR) 25 MG tablet Take 1 tablet (25 mg total) by mouth 2 (two) times daily. 180  tablet 3  . omeprazole (PRILOSEC) 20 MG capsule Take 20 mg by mouth daily.    . simvastatin (ZOCOR) 20 MG tablet Take 20 mg by mouth daily.    Marland Kitchen terazosin (HYTRIN) 10 MG capsule Take 10 mg by mouth daily.     . Vitamin D, Ergocalciferol, (DRISDOL) 1.25 MG (50000 UT) CAPS capsule Take 50,000 Units by mouth once a week.     No current facility-administered medications on file prior to visit.    No Known Allergies  Objective: Physical Exam  General: Well developed, nourished, no acute distress, awake, alert and oriented x 3  Vascular: Dorsalis pedis artery 1/4 bilateral, Posterior tibial artery 0/4 bilateral due to trace edema ankles today, skin temperature warm to cool proximal to distal bilateral lower extremities, mild varicosities/discoloration, Scant pedal hair present bilateral.  Neurological: Gross sensation present via light touch bilateral. Subjective numbness, tingling, burning, bilateral.  Dermatological: Skin is warm, dry, and supple bilateral, Nails 1-10 are tender, long, thick, and discolored with mild subungal debris, no webspace macerations present bilateral, no open lesions present bilateral, no callus/corns/hyperkeratotic tissue present bilateral. No signs of infection bilateral.  Musculoskeletal: Asymptomatic hammertoe boney deformities noted bilateral. Muscular strength within normal limits without pain on range of motion. No pain with calf compression bilateral.  Assessment and Plan:  Problem List Items Addressed This Visit    None    Visit Diagnoses    Onychomycosis    -  Primary   Pain of toe, unspecified laterality  Neuropathy       Coagulopathy ([facility])         -Examined patient.  -Discussed treatment options for painful mycotic nails. -Mechanically debrided and reduced mycotic nails with sterile nail nipper and dremel nail file without incident. -Patient to return in 3 months for follow up evaluation or sooner if symptoms worsen.  Landis Martins, DPM

## 2019-07-10 ENCOUNTER — Ambulatory Visit: Payer: Medicare Other | Admitting: Sports Medicine

## 2019-08-15 ENCOUNTER — Ambulatory Visit (INDEPENDENT_AMBULATORY_CARE_PROVIDER_SITE_OTHER): Payer: Medicare Other | Admitting: *Deleted

## 2019-08-15 DIAGNOSIS — I429 Cardiomyopathy, unspecified: Secondary | ICD-10-CM

## 2019-08-15 LAB — CUP PACEART REMOTE DEVICE CHECK
Battery Remaining Longevity: 65 mo
Battery Voltage: 3 V
Brady Statistic AP VP Percent: 68.48 %
Brady Statistic AP VS Percent: 20.14 %
Brady Statistic AS VP Percent: 5.41 %
Brady Statistic AS VS Percent: 5.97 %
Brady Statistic RA Percent Paced: 86.93 %
Brady Statistic RV Percent Paced: 73.12 %
Date Time Interrogation Session: 20210127191750
Lead Channel Impedance Value: 380 Ohm
Lead Channel Impedance Value: 456 Ohm
Lead Channel Impedance Value: 456 Ohm
Lead Channel Impedance Value: 532 Ohm
Lead Channel Pacing Threshold Amplitude: 0.75 V
Lead Channel Pacing Threshold Amplitude: 0.875 V
Lead Channel Pacing Threshold Pulse Width: 0.4 ms
Lead Channel Pacing Threshold Pulse Width: 0.4 ms
Lead Channel Sensing Intrinsic Amplitude: 0.75 mV
Lead Channel Sensing Intrinsic Amplitude: 0.75 mV
Lead Channel Sensing Intrinsic Amplitude: 19.25 mV
Lead Channel Sensing Intrinsic Amplitude: 19.25 mV
Lead Channel Setting Pacing Amplitude: 1.5 V
Lead Channel Setting Pacing Amplitude: 2 V
Lead Channel Setting Pacing Pulse Width: 0.4 ms
Lead Channel Setting Sensing Sensitivity: 0.9 mV

## 2019-08-15 NOTE — Progress Notes (Signed)
PPM Remote  

## 2019-10-04 ENCOUNTER — Ambulatory Visit (INDEPENDENT_AMBULATORY_CARE_PROVIDER_SITE_OTHER): Payer: Medicare Other | Admitting: Sports Medicine

## 2019-10-04 ENCOUNTER — Other Ambulatory Visit: Payer: Self-pay

## 2019-10-04 ENCOUNTER — Encounter: Payer: Self-pay | Admitting: Sports Medicine

## 2019-10-04 DIAGNOSIS — G629 Polyneuropathy, unspecified: Secondary | ICD-10-CM

## 2019-10-04 DIAGNOSIS — M79676 Pain in unspecified toe(s): Secondary | ICD-10-CM

## 2019-10-04 DIAGNOSIS — D689 Coagulation defect, unspecified: Secondary | ICD-10-CM

## 2019-10-04 DIAGNOSIS — B351 Tinea unguium: Secondary | ICD-10-CM | POA: Diagnosis not present

## 2019-10-04 NOTE — Progress Notes (Signed)
Subjective: Erik Brock is a 86 y.o. male patient seen today in office with complaint of painful thickened and elongated toenails; unable to trim. 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  Denies any acute problems.  Patient has no other pedal complaints at this time.   Patient Active Problem List   Diagnosis Date Noted  . Ascending aortic aneurysm ([place]) 09/05/2017  . Cardiomyopathy ([place]) 09/05/2017  . Chronic diastolic heart failure ([place]) 04/05/2015  . Dyslipidemia 04/05/2015  . Hypertensive heart disease with heart failure ([place]) 04/05/2015  . Cardiac pacemaker in situ 12/22/2014  . Pacemaker reprogramming/check 12/22/2014  . 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 CAD (coronary artery disease) 12/08/2014  . LBBB (left bundle branch block) 12/08/2014  . Gonalgia 11/11/2014  . Neuritis or radiculitis due to rupture of lumbar intervertebral disc 11/11/2014  . Trigger point of right shoulder region 10/14/2014  . Elevated PSA 07/30/2013  . GERD (gastroesophageal reflux disease) 07/30/2013  . Hyperlipidemia 07/30/2013  . Essential (primary) hypertension 07/30/2013  . Lumbar stenosis 07/30/2013    Current Outpatient Medications on File Prior to Visit  Medication Sig Dispense Refill  . alendronate (FOSAMAX) 70 MG tablet TAKE 1 TABLET BY MOUTH ONCE A WEEK    . aspirin 325 MG tablet Take 325 mg by mouth. Half tablet daily    . finasteride (PROSCAR) 5 MG tablet Take 5 mg by mouth daily.     Marland Kitchen gabapentin (NEURONTIN) 300 MG capsule Take 300 mg by mouth at bedtime.    Marland Kitchen levothyroxine (SYNTHROID, LEVOTHROID) 50 MCG tablet Take 50 mcg by mouth daily before breakfast.    . lisinopril-hydrochlorothiazide (ZESTORETIC) 20-25 MG tablet Take 1 tablet by mouth 2 (two) times daily. 90 tablet 1  . metoprolol tartrate (LOPRESSOR) 25 MG tablet Take 1 tablet (25 mg total) by mouth 2 (two) times daily. 180 tablet 3  . omeprazole  (PRILOSEC) 20 MG capsule Take 20 mg by mouth daily.    . simvastatin (ZOCOR) 20 MG tablet Take 20 mg by mouth daily.    Marland Kitchen terazosin (HYTRIN) 10 MG capsule Take 10 mg by mouth daily.     . Vitamin D, Ergocalciferol, (DRISDOL) 1.25 MG (50000 UT) CAPS capsule Take 50,000 Units by mouth once a week.     No current facility-administered medications on file prior to visit.    No Known Allergies  Objective: Physical Exam  General: Well developed, nourished, no acute distress, awake, alert and oriented x 3  Vascular: Dorsalis pedis artery 1/4 bilateral, Posterior tibial artery 0/4 bilateral due to trace edema ankles today, skin temperature warm to cool proximal to distal bilateral lower extremities, mild varicosities/discoloration, Scant pedal hair present bilateral.  Neurological: Gross sensation present via light touch bilateral. Subjective numbness, tingling, burning, bilateral.  Dermatological: Skin is warm, dry, and supple bilateral, Nails 1-10 are tender, long, thick, and discolored with mild subungal debris, no webspace macerations present bilateral, no open lesions present bilateral, no callus/corns/hyperkeratotic tissue present bilateral. No signs of infection bilateral.  Musculoskeletal: Asymptomatic hammertoe boney deformities noted bilateral. Muscular strength within normal limits without pain on range of motion. No pain with calf compression bilateral.  Assessment and Plan:  Problem List Items Addressed This Visit    None    Visit Diagnoses    Onychomycosis    -  Primary   Pain of toe, unspecified laterality       Neuropathy  Coagulopathy ([place])         -Examined patient.  -ABN signed this visit and a copy was provided to patient -Discussed treatment options for painful mycotic nails. -Mechanically debrided and reduced mycotic nails with sterile nail nipper and dremel nail file without incident. -Patient to return in 3 months for follow up evaluation or sooner if  symptoms worsen.  Landis Martins, DPM

## 2019-10-09 NOTE — Progress Notes (Signed)
Cardiology Office Note:    Date:  10/10/2019   ID:  Erik Brock, DOB 1927-11-29, MRN [id number]  PCP:  Mateo Flow, MD  Cardiologist:  Shirlee More, MD    Referring MD: Mateo Flow, MD    ASSESSMENT:    1. Hypertensive heart disease with heart failure ([place])   2. Chronic diastolic heart failure (HCC)   3. Cardiac pacemaker in situ   4. Ascending aortic aneurysm ([place])   5. Dyslipidemia    PLAN:    In order of problems listed above:  1. Stable blood pressure at target presently does not require loop diuretic continue ACE inhibitor thiazide diuretic and beta-blocker. 2. Compensated New [place] Heart Association class I continue current treatment 3. Stable function followed in device clinic I reviewed his last pacemaker download with the patient 4. In his age group and his comorbidities I would not do serial screening studies as he is not a good candidate for prophylactic intervention 5. Continue his statin   Next appointment: 6 months   Medication Adjustments/Labs and Tests Ordered: Current medicines are reviewed at length with the patient today.  Concerns regarding medicines are outlined above.  No orders of the defined types were placed in this encounter.  No orders of the defined types were placed in this encounter.   Chief Complaint  Patient presents with  . Follow-up  . Congestive Heart Failure    History of Present Illness:    Erik Brock is a 86 y.o. male with a hx of  hypertensive heart disease chronic diastolic heart failure mild coronary artery disease heart block and dual-chamber pacemaker. His pacemaker is a dual-chamber Medtronic advisa.  Other problems include ascending aortic aneurysm 4.1 cm on MRI 08/31/2017, left bundle branch block hyperlipidemia and cardiomyopathy with most recent ejection fraction 50 to 55% by echo 09/19/2017  last seen 02/18/2019. Compliance with diet, lifestyle and medications: Yes  In general Erik Brock is done well but he is becoming  increasingly frail.  He has had no falls palpitation syncope shortness of breath or chest pain continues to be followed by device clinic and his EKG today shows me 100% dual-chamber paced. Past Medical History:  Diagnosis Date  . Ascending aortic aneurysm ([place]) 09/05/2017  . 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 Bleeding nose   . CAD (coronary artery disease) 12/08/2014   Overview:  Mild, nonobsructive  Overview:  Mild, nonobsructive  . Cardiac pacemaker in situ 12/22/2014  . Cardiomyopathy ([place]) 09/05/2017  . Chronic diastolic heart failure ([place]) 04/05/2015  . Dyslipidemia 04/05/2015  . Elevated PSA 07/30/2013  . Essential (primary) hypertension 07/30/2013  . GERD (gastroesophageal reflux disease) 07/30/2013  . Gonalgia 11/11/2014  . Hyperlipidemia 07/30/2013  . Hypertension   . Hypertensive heart disease with heart failure ([place]) 04/05/2015  . LBBB (left bundle branch block) 12/08/2014  . Lumbar stenosis 07/30/2013  . Neuritis or radiculitis due to rupture of lumbar intervertebral disc 11/11/2014  . Pacemaker reprogramming/check 12/22/2014  . Thyroid disease   . Trigger point of right shoulder region 10/14/2014    Past Surgical History:  Procedure Laterality Date  . APPENDECTOMY    . INSERT / REPLACE / REMOVE PACEMAKER    . tonsills      Current Medications: Current Meds  Medication Sig  . alendronate (FOSAMAX) 70 MG tablet TAKE 1 TABLET BY MOUTH ONCE A WEEK  . aspirin 325 MG tablet Take 325 mg by mouth. Half tablet daily  . finasteride (PROSCAR) 5 MG tablet Take  5 mg by mouth daily.   Marland Kitchen gabapentin (NEURONTIN) 300 MG capsule Take 300 mg by mouth at bedtime.  Marland Kitchen levothyroxine (SYNTHROID, LEVOTHROID) 50 MCG tablet Take 50 mcg by mouth daily before breakfast.  . lisinopril-hydrochlorothiazide (ZESTORETIC) 20-25 MG tablet Take 1 tablet by mouth 2 (two) times daily.  . metoprolol tartrate (LOPRESSOR) 25 MG tablet Take 1 tablet (25 mg total) by mouth 2 (two) times daily.  Marland Kitchen omeprazole (PRILOSEC) 20 MG  capsule Take 20 mg by mouth daily.  . simvastatin (ZOCOR) 20 MG tablet Take 20 mg by mouth daily.  Marland Kitchen terazosin (HYTRIN) 10 MG capsule Take 10 mg by mouth daily.   . Vitamin D, Ergocalciferol, (DRISDOL) 1.25 MG (50000 UT) CAPS capsule Take 50,000 Units by mouth once a week.     Allergies:   Patient has no known allergies.   Social History   Socioeconomic History  . Marital status: Widowed    Spouse name: Not on file  . Number of children: Not on file  . Years of education: Not on file  . Highest education level: Not on file  Occupational History  . Not on file  Tobacco Use  . Smoking status: Never Smoker  . Smokeless tobacco: Never Used  Substance and Sexual Activity  . Alcohol use: No  . Drug use: No  . Sexual activity: Not on file  Other Topics Concern  . Not on file  Social History Narrative  . Not on file   Social Determinants of Health   Financial Resource Strain:   . Difficulty of Paying Living Expenses:   Food Insecurity:   . Worried About Charity fundraiser in the Last Year:   . Arboriculturist in the Last Year:   Transportation Needs:   . Film/video editor (Medical):   Marland Kitchen Lack of Transportation (Non-Medical):   Physical Activity:   . Days of Exercise per Week:   . Minutes of Exercise per Session:   Stress:   . Feeling of Stress :   Social Connections:   . Frequency of Communication with Friends and Family:   . Frequency of Social Gatherings with Friends and Family:   . Attends Religious Services:   . Active Member of Clubs or Organizations:   . Attends Archivist Meetings:   Marland Kitchen Marital Status:      Family History: The patient's family history includes Brain cancer in his father. ROS:   Please see the history of present illness.    All other systems reviewed and are negative.  EKGs/Labs/Other Studies Reviewed:    The following studies were reviewed today:  EKG:  EKG ordered today and personally reviewed.  The ekg ordered today  demonstrates chamber paced rhythm  02/25/2019 cholesterol 188 HDL 38 LDL 104 lipids at target triglycerides 230 creatinine 1.50 stable CKD TSH normal  Physical Exam:    VS:  BP (!) 120/58 (BP Location: Right Arm, Patient Position: Sitting, Cuff Size: Normal)   Pulse 78   Temp (!) 97.5 F (36.4 C)   Ht 5\' 7"  (1.702 m)   Wt 188 lb 12.8 oz (85.6 kg)   SpO2 99%   BMI 29.57 kg/m     Wt Readings from Last 3 Encounters:  10/10/19 188 lb 12.8 oz (85.6 kg)  02/18/19 180 lb (81.6 kg)  08/21/18 180 lb 6 oz (81.8 kg)     GEN: Frail he struggled transferring and dressing himself after EKG well nourished, well developed in no acute  distress HEENT: Normal NECK: No JVD; No carotid bruits LYMPHATICS: No lymphadenopathy CARDIAC: RRR, no murmurs, rubs, gallops RESPIRATORY:  Clear to auscultation without rales, wheezing or rhonchi  ABDOMEN: Soft, non-tender, non-distended MUSCULOSKELETAL:  No edema; No deformity  SKIN: Warm and dry NEUROLOGIC:  Alert and oriented x 3 PSYCHIATRIC:  Normal affect    Signed, Shirlee More, MD  10/10/2019 5:12 PM    [place]

## 2019-10-10 ENCOUNTER — Ambulatory Visit (INDEPENDENT_AMBULATORY_CARE_PROVIDER_SITE_OTHER): Payer: Medicare Other | Admitting: Cardiology

## 2019-10-10 ENCOUNTER — Encounter: Payer: Self-pay | Admitting: Cardiology

## 2019-10-10 ENCOUNTER — Other Ambulatory Visit: Payer: Self-pay

## 2019-10-10 VITALS — BP 120/58 | HR 78 | Temp 97.5°F | Ht 67.0 in | Wt 188.8 lb

## 2019-10-10 DIAGNOSIS — I110 Hypertensive heart disease with heart failure: Secondary | ICD-10-CM

## 2019-10-10 DIAGNOSIS — Z950 Presence of cardiac pacemaker: Secondary | ICD-10-CM

## 2019-10-10 DIAGNOSIS — E785 Hyperlipidemia, unspecified: Secondary | ICD-10-CM

## 2019-10-10 DIAGNOSIS — I712 Thoracic aortic aneurysm, without rupture: Secondary | ICD-10-CM | POA: Diagnosis not present

## 2019-10-10 DIAGNOSIS — I5032 Chronic diastolic (congestive) heart failure: Secondary | ICD-10-CM

## 2019-10-10 DIAGNOSIS — I7121 Aneurysm of the ascending aorta, without rupture: Secondary | ICD-10-CM

## 2019-10-10 NOTE — Patient Instructions (Signed)

## 2019-10-11 NOTE — Addendum Note (Signed)
Addended by: Resa Miner I on: 10/11/2019 07:57 AM   Modules accepted: Orders

## 2019-11-13 LAB — CUP PACEART REMOTE DEVICE CHECK
Battery Remaining Longevity: 56 mo
Battery Voltage: 2.99 V
Brady Statistic AP VP Percent: 75.26 %
Brady Statistic AP VS Percent: 18.13 %
Brady Statistic AS VP Percent: 3.23 %
Brady Statistic AS VS Percent: 3.38 %
Brady Statistic RA Percent Paced: 92.52 %
Brady Statistic RV Percent Paced: 78 %
Date Time Interrogation Session: 20210427194103
Implantable Lead Implant Date: 20160524
Implantable Lead Implant Date: 20160524
Implantable Lead Location: 753859
Implantable Lead Location: 753860
Implantable Lead Model: 5076
Implantable Lead Model: 5076
Implantable Pulse Generator Implant Date: 20160524
Lead Channel Impedance Value: 361 Ohm
Lead Channel Impedance Value: 437 Ohm
Lead Channel Impedance Value: 456 Ohm
Lead Channel Impedance Value: 532 Ohm
Lead Channel Pacing Threshold Amplitude: 0.625 V
Lead Channel Pacing Threshold Amplitude: 0.875 V
Lead Channel Pacing Threshold Pulse Width: 0.4 ms
Lead Channel Pacing Threshold Pulse Width: 0.4 ms
Lead Channel Sensing Intrinsic Amplitude: 0.875 mV
Lead Channel Sensing Intrinsic Amplitude: 0.875 mV
Lead Channel Sensing Intrinsic Amplitude: 31.625 mV
Lead Channel Sensing Intrinsic Amplitude: 31.625 mV
Lead Channel Setting Pacing Amplitude: 1.25 V
Lead Channel Setting Pacing Amplitude: 2 V
Lead Channel Setting Pacing Pulse Width: 0.4 ms
Lead Channel Setting Sensing Sensitivity: 0.9 mV

## 2019-11-14 ENCOUNTER — Ambulatory Visit (INDEPENDENT_AMBULATORY_CARE_PROVIDER_SITE_OTHER): Payer: Medicare Other | Admitting: *Deleted

## 2019-11-14 DIAGNOSIS — I429 Cardiomyopathy, unspecified: Secondary | ICD-10-CM

## 2019-11-15 NOTE — Progress Notes (Signed)
PPM Remote  

## 2020-01-08 ENCOUNTER — Encounter: Payer: Self-pay | Admitting: Sports Medicine

## 2020-01-08 ENCOUNTER — Other Ambulatory Visit: Payer: Self-pay

## 2020-01-08 ENCOUNTER — Ambulatory Visit (INDEPENDENT_AMBULATORY_CARE_PROVIDER_SITE_OTHER): Payer: Medicare Other | Admitting: Sports Medicine

## 2020-01-08 DIAGNOSIS — B351 Tinea unguium: Secondary | ICD-10-CM | POA: Diagnosis not present

## 2020-01-08 DIAGNOSIS — G629 Polyneuropathy, unspecified: Secondary | ICD-10-CM | POA: Diagnosis not present

## 2020-01-08 DIAGNOSIS — D689 Coagulation defect, unspecified: Secondary | ICD-10-CM | POA: Diagnosis not present

## 2020-01-08 DIAGNOSIS — M79676 Pain in unspecified toe(s): Secondary | ICD-10-CM

## 2020-01-08 NOTE — Progress Notes (Signed)
Subjective: Erik Brock is a 86 y.o. male patient seen today in office with complaint of painful thickened and elongated toenails; unable to trim. Patient reports that his back is not doing good and has follow-up with his back doctor.  Denies any other acute problems.  Patient has no other pedal complaints at this time.   Patient Active Problem List   Diagnosis Date Noted  . Ascending aortic aneurysm ([place]) 09/05/2017  . Cardiomyopathy ([place]) 09/05/2017  . Chronic diastolic heart failure ([place]) 04/05/2015  . Dyslipidemia 04/05/2015  . Hypertensive heart disease with heart failure ([place]) 04/05/2015  . Cardiac pacemaker in situ 12/22/2014  . Pacemaker reprogramming/check 12/22/2014  . 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 CAD (coronary artery disease) 12/08/2014  . LBBB (left bundle branch block) 12/08/2014  . Gonalgia 11/11/2014  . Neuritis or radiculitis due to rupture of lumbar intervertebral disc 11/11/2014  . Trigger point of right shoulder region 10/14/2014  . Elevated PSA 07/30/2013  . GERD (gastroesophageal reflux disease) 07/30/2013  . Hyperlipidemia 07/30/2013  . Essential (primary) hypertension 07/30/2013  . Lumbar stenosis 07/30/2013    Current Outpatient Medications on File Prior to Visit  Medication Sig Dispense Refill  . alendronate (FOSAMAX) 70 MG tablet TAKE 1 TABLET BY MOUTH ONCE A WEEK    . aspirin 325 MG tablet Take 325 mg by mouth. Half tablet daily    . finasteride (PROSCAR) 5 MG tablet Take 5 mg by mouth daily.     Marland Kitchen gabapentin (NEURONTIN) 300 MG capsule Take 300 mg by mouth at bedtime.    Marland Kitchen levothyroxine (SYNTHROID, LEVOTHROID) 50 MCG tablet Take 50 mcg by mouth daily before breakfast.    . lisinopril-hydrochlorothiazide (ZESTORETIC) 20-25 MG tablet Take 1 tablet by mouth 2 (two) times daily. 90 tablet 1  . metoprolol tartrate (LOPRESSOR) 25 MG tablet Take 1 tablet (25 mg total) by mouth 2 (two) times daily. 180 tablet 3  . omeprazole (PRILOSEC) 20 MG capsule Take 20 mg  by mouth daily.    . simvastatin (ZOCOR) 20 MG tablet Take 20 mg by mouth daily.    Marland Kitchen terazosin (HYTRIN) 10 MG capsule Take 10 mg by mouth daily.     . Vitamin D, Ergocalciferol, (DRISDOL) 1.25 MG (50000 UT) CAPS capsule Take 50,000 Units by mouth once a week.     No current facility-administered medications on file prior to visit.    No Known Allergies  Objective: Physical Exam  General: Well developed, nourished, no acute distress, awake, alert and oriented x 3  Vascular: Dorsalis pedis artery 1/4 bilateral, Posterior tibial artery 0/4 bilateral due to trace edema ankles today, skin temperature warm to cool proximal to distal bilateral lower extremities, mild varicosities/discoloration, Scant pedal hair present bilateral.  Neurological: Gross sensation present via light touch bilateral. Subjective numbness, tingling, burning, bilateral unchanged from prior secondary to back.  Dermatological: Skin is warm, dry, and supple bilateral, Nails 1-10 are tender, long, thick, and discolored with mild subungal debris, no webspace macerations present bilateral, no open lesions present bilateral, no callus/corns/hyperkeratotic tissue present bilateral. No signs of infection bilateral.  Musculoskeletal: Asymptomatic hammertoe boney deformities noted bilateral. Muscular strength within normal limits without pain on range of motion. No pain with calf compression bilateral.  Assessment and Plan:  Problem List Items Addressed This Visit    None    Visit Diagnoses    Onychomycosis    -  Primary   Pain of toe, unspecified laterality       Neuropathy  Coagulopathy ([place])         -Examined patient.  -Re-Discussed treatment options for painful mycotic nails. -Mechanically debrided and reduced mycotic nails with sterile nail nipper and dremel nail file without incident. -Patient to return in 3 months for follow up evaluation or sooner if symptoms worsen.  Landis Martins, DPM

## 2020-02-13 ENCOUNTER — Ambulatory Visit (INDEPENDENT_AMBULATORY_CARE_PROVIDER_SITE_OTHER): Payer: Medicare Other | Admitting: *Deleted

## 2020-02-13 DIAGNOSIS — I441 Atrioventricular block, second degree: Secondary | ICD-10-CM

## 2020-02-13 LAB — CUP PACEART REMOTE DEVICE CHECK
Battery Remaining Longevity: 57 mo
Battery Voltage: 2.99 V
Brady Statistic AP VP Percent: 70.18 %
Brady Statistic AP VS Percent: 20.47 %
Brady Statistic AS VP Percent: 5.12 %
Brady Statistic AS VS Percent: 4.24 %
Brady Statistic RA Percent Paced: 89.24 %
Brady Statistic RV Percent Paced: 74.47 %
Date Time Interrogation Session: 20210729081055
Implantable Lead Implant Date: 20160524
Implantable Lead Implant Date: 20160524
Implantable Lead Location: 753859
Implantable Lead Location: 753860
Implantable Lead Model: 5076
Implantable Lead Model: 5076
Implantable Pulse Generator Implant Date: 20160524
Lead Channel Impedance Value: 380 Ohm
Lead Channel Impedance Value: 456 Ohm
Lead Channel Impedance Value: 475 Ohm
Lead Channel Impedance Value: 551 Ohm
Lead Channel Pacing Threshold Amplitude: 0.625 V
Lead Channel Pacing Threshold Amplitude: 0.875 V
Lead Channel Pacing Threshold Pulse Width: 0.4 ms
Lead Channel Pacing Threshold Pulse Width: 0.4 ms
Lead Channel Sensing Intrinsic Amplitude: 1 mV
Lead Channel Sensing Intrinsic Amplitude: 1 mV
Lead Channel Sensing Intrinsic Amplitude: 18 mV
Lead Channel Sensing Intrinsic Amplitude: 18 mV
Lead Channel Setting Pacing Amplitude: 1.25 V
Lead Channel Setting Pacing Amplitude: 2 V
Lead Channel Setting Pacing Pulse Width: 0.4 ms
Lead Channel Setting Sensing Sensitivity: 0.9 mV

## 2020-02-14 ENCOUNTER — Telehealth: Payer: Self-pay | Admitting: *Deleted

## 2020-02-14 MED ORDER — METOPROLOL TARTRATE 50 MG PO TABS: 50.0000 mg | ORAL_TABLET | Freq: Two times a day (BID) | ORAL | 2 refills | Status: DC

## 2020-02-14 NOTE — Telephone Encounter (Signed)
-----   Message from Will Meredith Leeds, MD sent at 02/13/2020  3:23 PM EDT ----- Abnormal device interrogation reviewed.  Lead parameters and battery status stable.  Short NSVT. Increase metoprolol to 50 mg BID.

## 2020-02-14 NOTE — Telephone Encounter (Signed)
Informed patient of results and verbal understanding expressed. Pt agreeable to increased dosing. Advised to call office back if issues begin after med increase, such as dizziness/low BP. Informed that I would not send a new Rx yet until we see how he does on the new dose.  Aware will update dosing in chart. Patient verbalized understanding and agreeable to plan.

## 2020-02-17 NOTE — Progress Notes (Signed)
Remote pacemaker transmission.   

## 2020-04-06 DIAGNOSIS — R040 Epistaxis: Secondary | ICD-10-CM | POA: Insufficient documentation

## 2020-04-06 DIAGNOSIS — E079 Disorder of thyroid, unspecified: Secondary | ICD-10-CM | POA: Insufficient documentation

## 2020-04-06 DIAGNOSIS — I10 Essential (primary) hypertension: Secondary | ICD-10-CM | POA: Insufficient documentation

## 2020-04-06 NOTE — Progress Notes (Signed)
Cardiology Office Note:    Date:  04/07/2020   ID:  Erik Brock, DOB [birth year], MRN [id number]  PCP:  Mateo Flow, MD  Cardiologist:  Shirlee More, MD    Referring MD: Mateo Flow, MD    ASSESSMENT:    1. Hypertensive heart disease with heart failure ([place])   2. AV block, Mobitz II   3. Cardiac pacemaker in situ   4. Ascending aortic aneurysm ([place])   5. Dyslipidemia    PLAN:    In order of problems listed above:  1. Stable for his age blood pressure is at target greater than 90 heart failure compensated continue current antihypertensive at this time does not require loop diuretic check renal function potassium 2. Stable device function followed in our clinic 3. At this point with age and comorbidities I would continue imaging his aorta 4. Continue statin check lipid profile liver function   Next appointment: 6 months   Medication Adjustments/Labs and Tests Ordered: Current medicines are reviewed at length with the patient today.  Concerns regarding medicines are outlined above.  No orders of the defined types were placed in this encounter.  No orders of the defined types were placed in this encounter.   Chief Complaint  Patient presents with  . Follow-up  . Congestive Heart Failure    History of Present Illness:    Erik Brock is a 86 y.o. male with a hx of  hypertensive heart disease chronic diastolic heart failure mild coronary artery disease heart block and dual-chamber pacemaker. His pacemaker is a dual-chamber Medtronic advisa.  Other problems include ascending aortic aneurysm 4.1 cm on MRI 08/31/2017, left bundle branch block hyperlipidemia and cardiomyopathy with most recent ejection fraction 50 to 55% by echo 09/19/2017  last seen 10/10/2019.  Compliance with diet, lifestyle and medications: yes   His most recent pacemaker download 02/13/2020 independently reviewed battery lead parameters are normal.  He has had a short episodes of nonsustained ventricular  tachycardia and his dose of beta-blocker was increased.  In general he is really done well his son is pleased with the quality of his life no angina dyspnea or palpitation.  We will continue to follow his device in our practice.  His son did not increase his beta-blocker.  He is overdue for labs we will do them today Past Medical History:  Diagnosis Date  . Ascending aortic aneurysm ([place]) 09/05/2017  . 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 Bleeding nose   . CAD (coronary artery disease) 12/08/2014   Overview:  Mild, nonobsructive  Overview:  Mild, nonobsructive  . Cardiac pacemaker in situ 12/22/2014  . Cardiomyopathy ([place]) 09/05/2017  . Chronic diastolic heart failure ([place]) 04/05/2015  . Dyslipidemia 04/05/2015  . Elevated PSA 07/30/2013  . Essential (primary) hypertension 07/30/2013  . GERD (gastroesophageal reflux disease) 07/30/2013  . Gonalgia 11/11/2014  . Hyperlipidemia 07/30/2013  . Hypertension   . Hypertensive heart disease with heart failure ([place]) 04/05/2015  . LBBB (left bundle branch block) 12/08/2014  . Lumbar stenosis 07/30/2013  . Neuritis or radiculitis due to rupture of lumbar intervertebral disc 11/11/2014  . Pacemaker reprogramming/check 12/22/2014  . Thyroid disease   . Trigger point of right shoulder region 10/14/2014    Past Surgical History:  Procedure Laterality Date  . APPENDECTOMY    . INSERT / REPLACE / REMOVE PACEMAKER    . tonsills      Current Medications: Current Meds  Medication Sig  . alendronate (FOSAMAX) 70 MG tablet TAKE  1 TABLET BY MOUTH ONCE A WEEK  . aspirin 81 MG EC tablet Take 81 mg by mouth daily. Half tablet daily  . finasteride (PROSCAR) 5 MG tablet Take 5 mg by mouth daily.   Marland Kitchen gabapentin (NEURONTIN) 300 MG capsule Take 300 mg by mouth at bedtime.  Marland Kitchen levothyroxine (SYNTHROID, LEVOTHROID) 50 MCG tablet Take 50 mcg by mouth daily before breakfast.  . lisinopril-hydrochlorothiazide (ZESTORETIC) 20-25 MG tablet Take 1 tablet by mouth 2 (two) times daily.   . metoprolol tartrate (LOPRESSOR) 50 MG tablet Take 1 tablet (50 mg total) by mouth 2 (two) times daily.  Marland Kitchen omeprazole (PRILOSEC) 20 MG capsule Take 20 mg by mouth daily.  . simvastatin (ZOCOR) 20 MG tablet Take 20 mg by mouth daily.  Marland Kitchen terazosin (HYTRIN) 10 MG capsule Take 10 mg by mouth daily.   Marland Kitchen tiZANidine (ZANAFLEX) 4 MG tablet Take 2 mg by mouth daily as needed.  . Vitamin D, Ergocalciferol, (DRISDOL) 1.25 MG (50000 UT) CAPS capsule Take 50,000 Units by mouth once a week.     Allergies:   Patient has no known allergies.   Social History   Socioeconomic History  . Marital status: Widowed    Spouse name: Not on file  . Number of children: Not on file  . Years of education: Not on file  . Highest education level: Not on file  Occupational History  . Not on file  Tobacco Use  . Smoking status: Never Smoker  . Smokeless tobacco: Never Used  Vaping Use  . Vaping Use: Never used  Substance and Sexual Activity  . Alcohol use: No  . Drug use: No  . Sexual activity: Not on file  Other Topics Concern  . Not on file  Social History Narrative  . Not on file   Social Determinants of Health   Financial Resource Strain:   . Difficulty of Paying Living Expenses: Not on file  Food Insecurity:   . Worried About Charity fundraiser in the Last Year: Not on file  . Ran Out of Food in the Last Year: Not on file  Transportation Needs:   . Lack of Transportation (Medical): Not on file  . Lack of Transportation (Non-Medical): Not on file  Physical Activity:   . Days of Exercise per Week: Not on file  . Minutes of Exercise per Session: Not on file  Stress:   . Feeling of Stress : Not on file  Social Connections:   . Frequency of Communication with Friends and Family: Not on file  . Frequency of Social Gatherings with Friends and Family: Not on file  . Attends Religious Services: Not on file  . Active Member of Clubs or Organizations: Not on file  . Attends Archivist  Meetings: Not on file  . Marital Status: Not on file     Family History: The patient's family history includes Brain cancer in his father. ROS:   Please see the history of present illness.    All other systems reviewed and are negative.  EKGs/Labs/Other Studies Reviewed:    The following studies were reviewed today:    Recent Labs: No results found for requested labs within last 8760 hours.  Recent Lipid Panel No results found for: CHOL, TRIG, HDL, CHOLHDL, VLDL, LDLCALC, LDLDIRECT  Physical Exam:    VS:  BP (!) 152/78   Pulse 72   Ht 5\' 7"  (1.702 m)   Wt 190 lb 12.8 oz (86.5 kg)   SpO2 96%  BMI 29.88 kg/m     Wt Readings from Last 3 Encounters:  04/07/20 190 lb 12.8 oz (86.5 kg)  10/10/19 188 lb 12.8 oz (85.6 kg)  02/18/19 180 lb (81.6 kg)     GEN:  Well nourished, well developed in no acute distress HEENT: Normal NECK: No JVD; No carotid bruits LYMPHATICS: No lymphadenopathy CARDIAC: RRR, no murmurs, rubs, gallops RESPIRATORY:  Clear to auscultation without rales, wheezing or rhonchi  ABDOMEN: Soft, non-tender, non-distended MUSCULOSKELETAL:  No edema; No deformity  SKIN: Warm and dry NEUROLOGIC:  Alert and oriented x 3 PSYCHIATRIC:  Normal affect    Signed, Shirlee More, MD  04/07/2020 9:44 AM    [place]

## 2020-04-07 ENCOUNTER — Encounter: Payer: Self-pay | Admitting: Cardiology

## 2020-04-07 ENCOUNTER — Ambulatory Visit (INDEPENDENT_AMBULATORY_CARE_PROVIDER_SITE_OTHER): Payer: Medicare Other | Admitting: Cardiology

## 2020-04-07 ENCOUNTER — Other Ambulatory Visit: Payer: Self-pay

## 2020-04-07 VITALS — BP 152/78 | HR 72 | Ht 67.0 in | Wt 190.8 lb

## 2020-04-07 DIAGNOSIS — I110 Hypertensive heart disease with heart failure: Secondary | ICD-10-CM

## 2020-04-07 DIAGNOSIS — Z950 Presence of cardiac pacemaker: Secondary | ICD-10-CM | POA: Diagnosis not present

## 2020-04-07 DIAGNOSIS — I441 Atrioventricular block, second degree: Secondary | ICD-10-CM | POA: Diagnosis not present

## 2020-04-07 DIAGNOSIS — E785 Hyperlipidemia, unspecified: Secondary | ICD-10-CM

## 2020-04-07 DIAGNOSIS — I712 Thoracic aortic aneurysm, without rupture: Secondary | ICD-10-CM

## 2020-04-07 DIAGNOSIS — I7121 Aneurysm of the ascending aorta, without rupture: Secondary | ICD-10-CM

## 2020-04-07 NOTE — Addendum Note (Signed)
Addended by: Mendel Ryder on: 04/07/2020 09:50 AM   Modules accepted: Orders

## 2020-04-07 NOTE — Addendum Note (Signed)
Addended by: Mendel Ryder on: 04/07/2020 09:56 AM   Modules accepted: Orders

## 2020-04-07 NOTE — Patient Instructions (Signed)
Medication Instructions:  Your physician recommends that you continue on your current medications as directed. Please refer to the Current Medication list given to you today.  *If you need a refill on your cardiac medications before your next appointment, please call your pharmacy*   Lab Work: Cmp,Lipids,Cbc- Today   If you have labs (blood work) drawn today and your tests are completely normal, you will receive your results only by: Marland Kitchen MyChart Message (if you have MyChart) OR . A paper copy in the mail If you have any lab test that is abnormal or we need to change your treatment, we will call you to review the results.   Testing/Procedures: None ordered     Follow-Up: At [facility], you and your health needs are our priority.  As part of our continuing mission to provide you with exceptional heart care, we have created designated Provider Care Teams.  These Care Teams include your primary Cardiologist (physician) and Advanced Practice Providers (APPs -  Physician Assistants and Nurse Practitioners) who all work together to provide you with the care you need, when you need it.  We recommend signing up for the patient portal called "MyChart".  Sign up information is provided on this After Visit Summary.  MyChart is used to connect with patients for Virtual Visits (Telemedicine).  Patients are able to view lab/test results, encounter notes, upcoming appointments, etc.  Non-urgent messages can be sent to your provider as well.   To learn more about what you can do with MyChart, go to [email or web address].    Your next appointment:   6 month(s)  The format for your next appointment:   In Person  Provider:   Shirlee More, MD   Other Instructions None

## 2020-04-08 LAB — LIPID PANEL
Chol/HDL Ratio: 4.2 ratio (ref 0.0–5.0)
Cholesterol, Total: 164 mg/dL (ref 100–199)
HDL: 39 mg/dL — ABNORMAL LOW (ref >39)
LDL Chol Calc (NIH): 96 mg/dL (ref 0–99)
Triglycerides: 168 mg/dL — ABNORMAL HIGH (ref 0–149)
VLDL Cholesterol Cal: 29 mg/dL (ref 5–40)

## 2020-04-08 LAB — CBC
Hematocrit: 41.3 % (ref 37.5–51.0)
Hemoglobin: 13.9 g/dL (ref 13.0–17.7)
MCH: 33.6 pg — ABNORMAL HIGH (ref 26.6–33.0)
MCHC: 33.7 g/dL (ref 31.5–35.7)
MCV: 100 fL — ABNORMAL HIGH (ref 79–97)
Platelets: 172 10*3/uL (ref 150–450)
RBC: 4.14 x10E6/uL (ref 4.14–5.80)
RDW: 13.9 % (ref 11.6–15.4)
WBC: 5.7 10*3/uL (ref 3.4–10.8)

## 2020-04-08 LAB — COMPREHENSIVE METABOLIC PANEL
ALT: 17 IU/L (ref 0–44)
AST: 27 IU/L (ref 0–40)
Albumin/Globulin Ratio: 1.7 (ref 1.2–2.2)
Albumin: 4.3 g/dL (ref 3.5–4.6)
Alkaline Phosphatase: 39 IU/L — ABNORMAL LOW (ref 44–121)
BUN/Creatinine Ratio: 15 (ref 10–24)
BUN: 23 mg/dL (ref 10–36)
Bilirubin Total: 0.5 mg/dL (ref 0.0–1.2)
CO2: 25 mmol/L (ref 20–29)
Calcium: 9.1 mg/dL (ref 8.6–10.2)
Chloride: 103 mmol/L (ref 96–106)
Creatinine, Ser: 1.5 mg/dL — ABNORMAL HIGH (ref 0.76–1.27)
GFR calc Af Amer: 47 mL/min/{1.73_m2} — ABNORMAL LOW (ref >59)
GFR calc non Af Amer: 40 mL/min/{1.73_m2} — ABNORMAL LOW (ref >59)
Globulin, Total: 2.6 g/dL (ref 1.5–4.5)
Glucose: 99 mg/dL (ref 65–99)
Potassium: 5 mmol/L (ref 3.5–5.2)
Sodium: 140 mmol/L (ref 134–144)
Total Protein: 6.9 g/dL (ref 6.0–8.5)

## 2020-04-16 ENCOUNTER — Encounter: Payer: Self-pay | Admitting: Podiatry

## 2020-04-16 ENCOUNTER — Other Ambulatory Visit: Payer: Self-pay

## 2020-04-16 ENCOUNTER — Ambulatory Visit (INDEPENDENT_AMBULATORY_CARE_PROVIDER_SITE_OTHER): Payer: Medicare Other | Admitting: Podiatry

## 2020-04-16 DIAGNOSIS — D689 Coagulation defect, unspecified: Secondary | ICD-10-CM

## 2020-04-16 DIAGNOSIS — B351 Tinea unguium: Secondary | ICD-10-CM | POA: Diagnosis not present

## 2020-04-16 DIAGNOSIS — M79675 Pain in left toe(s): Secondary | ICD-10-CM

## 2020-04-16 DIAGNOSIS — G629 Polyneuropathy, unspecified: Secondary | ICD-10-CM

## 2020-04-16 DIAGNOSIS — M79674 Pain in right toe(s): Secondary | ICD-10-CM

## 2020-04-16 NOTE — Progress Notes (Signed)
Subjective:  Patient ID: Erik Brock, male    DOB: August 03, 1927,  MRN: [id number]  86 y.o. male presents with at risk foot care with history of peripheral neuropathy and painful thick toenails that are difficult to trim. Pain interferes with ambulation. Aggravating factors include wearing enclosed shoe gear. Pain is relieved with periodic professional debridement..    Review of Systems: Negative except as noted in the HPI.  Past Medical History:  Diagnosis Date  . Ascending aortic aneurysm ([place]) 09/05/2017  . 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 Bleeding nose   . CAD (coronary artery disease) 12/08/2014   Overview:  Mild, nonobsructive  Overview:  Mild, nonobsructive  . Cardiac pacemaker in situ 12/22/2014  . Cardiomyopathy ([place]) 09/05/2017  . Chronic diastolic heart failure ([place]) 04/05/2015  . Dyslipidemia 04/05/2015  . Elevated PSA 07/30/2013  . Essential (primary) hypertension 07/30/2013  . GERD (gastroesophageal reflux disease) 07/30/2013  . Gonalgia 11/11/2014  . Hyperlipidemia 07/30/2013  . Hypertension   . Hypertensive heart disease with heart failure ([place]) 04/05/2015  . LBBB (left bundle branch block) 12/08/2014  . Lumbar stenosis 07/30/2013  . Neuritis or radiculitis due to rupture of lumbar intervertebral disc 11/11/2014  . Pacemaker reprogramming/check 12/22/2014  . Thyroid disease   . Trigger point of right shoulder region 10/14/2014   Past Surgical History:  Procedure Laterality Date  . APPENDECTOMY    . INSERT / REPLACE / REMOVE PACEMAKER    . tonsills     Patient Active Problem List   Diagnosis Date Noted  . Thyroid disease   . Hypertension   . Bleeding nose   . Thoracic spondylosis 06/10/2019  . Ascending aortic aneurysm ([place]) 09/05/2017  . Cardiomyopathy ([place]) 09/05/2017  . Chronic diastolic heart failure ([place]) 04/05/2015  . Dyslipidemia 04/05/2015  . Hypertensive heart disease with heart failure ([place]) 04/05/2015  . Cardiac pacemaker in situ 12/22/2014  . Pacemaker  reprogramming/check 12/22/2014  . 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 CAD (coronary artery disease) 12/08/2014  . LBBB (left bundle branch block) 12/08/2014  . Gonalgia 11/11/2014  . Neuritis or radiculitis due to rupture of lumbar intervertebral disc 11/11/2014  . Trigger point of right shoulder region 10/14/2014  . Elevated PSA 07/30/2013  . GERD (gastroesophageal reflux disease) 07/30/2013  . Hyperlipidemia 07/30/2013  . Essential (primary) hypertension 07/30/2013  . Lumbar stenosis 07/30/2013    Current Outpatient Medications:  .  alendronate (FOSAMAX) 70 MG tablet, TAKE 1 TABLET BY MOUTH ONCE A WEEK, Disp: , Rfl:  .  aspirin 81 MG EC tablet, Take 81 mg by mouth daily. Half tablet daily, Disp: , Rfl:  .  finasteride (PROSCAR) 5 MG tablet, Take 5 mg by mouth daily. , Disp: , Rfl:  .  gabapentin (NEURONTIN) 300 MG capsule, Take 300 mg by mouth at bedtime., Disp: , Rfl:  .  levothyroxine (SYNTHROID, LEVOTHROID) 50 MCG tablet, Take 50 mcg by mouth daily before breakfast., Disp: , Rfl:  .  lisinopril-hydrochlorothiazide (ZESTORETIC) 20-25 MG tablet, Take 1 tablet by mouth 2 (two) times daily., Disp: 90 tablet, Rfl: 1 .  metoprolol tartrate (LOPRESSOR) 50 MG tablet, Take 1 tablet (50 mg total) by mouth 2 (two) times daily., Disp: 180 tablet, Rfl: 2 .  omeprazole (PRILOSEC) 20 MG capsule, Take 20 mg by mouth daily., Disp: , Rfl:  .  simvastatin (ZOCOR) 20 MG tablet, Take 20 mg by mouth daily., Disp: , Rfl:  .  terazosin (HYTRIN) 10 MG capsule, Take 10 mg by mouth daily. ,  Disp: , Rfl:  .  tiZANidine (ZANAFLEX) 4 MG tablet, Take 2 mg by mouth daily as needed., Disp: , Rfl:  .  Vitamin D, Ergocalciferol, (DRISDOL) 1.25 MG (50000 UT) CAPS capsule, Take 50,000 Units by mouth once a week., Disp: , Rfl:  No Known Allergies Social History   Occupational History  . Not on file  Tobacco Use  . Smoking status: Never Smoker  . Smokeless tobacco: Never Used  Vaping Use  . Vaping Use: Never used   Substance and Sexual Activity  . Alcohol use: No  . Drug use: No  . Sexual activity: Not on file    Objective:   Constitutional Pt is a pleasant 86 y.o. Caucasian male in NAD.Marland Kitchen AAO x 3.   Vascular Capillary refill time to remaining digits <3 seconds. Faintly palpable DP pulse(s) b/l lower extremities. Nonpalpable PT pulse(s) b/l lower extremities. Pedal hair sparse. Lower extremity skin temperature gradient within normal limits. Varicosities present b/l. No cyanosis or clubbing noted.  Neurologic Normal speech. Oriented to person, place, and time. Protective sensation intact 5/5 intact bilaterally with 10g monofilament b/l.  Dermatologic Pedal skin with normal turgor, texture and tone bilaterally. No open wounds bilaterally. No interdigital macerations bilaterally. Toenails 1-5 b/l elongated, discolored, dystrophic, thickened, crumbly with subungual debris and tenderness to dorsal palpation.  Orthopedic: Normal muscle strength 5/5 to all lower extremity muscle groups bilaterally. No pain crepitus or joint limitation noted with ROM b/l. Hammertoes noted to the b/l lower extremities.   Radiographs: None Assessment:   1. Pain due to onychomycosis of toenails of both feet   2. Neuropathy   3. Coagulopathy ([place])    Plan:  Patient was evaluated and treated and all questions answered.  Onychomycosis with pain -Nails palliatively debridement as below. -Educated on self-care  Procedure: Nail Debridement Rationale: Pain Type of Debridement: manual, sharp debridement. Instrumentation: Nail nipper, rotary burr. Number of Nails: 10  -Examined patient. -No new findings. No new orders. -Toenails 1-5 b/l were debrided in length and girth with sterile nail nippers and dremel without iatrogenic bleeding.  -Patient to report any pedal injuries to medical professional immediately. -Patient to continue soft, supportive shoe gear daily. -Patient/POA to call should there be question/concern in  the interim.  Return in about 3 months (around 07/16/2020).  Marzetta Board, DPM

## 2020-05-14 ENCOUNTER — Ambulatory Visit (INDEPENDENT_AMBULATORY_CARE_PROVIDER_SITE_OTHER): Payer: Medicare Other

## 2020-05-14 DIAGNOSIS — I441 Atrioventricular block, second degree: Secondary | ICD-10-CM | POA: Diagnosis not present

## 2020-05-15 LAB — CUP PACEART REMOTE DEVICE CHECK
Battery Remaining Longevity: 48 mo
Battery Voltage: 2.99 V
Brady Statistic AP VP Percent: 76.56 %
Brady Statistic AP VS Percent: 15.52 %
Brady Statistic AS VP Percent: 4.79 %
Brady Statistic AS VS Percent: 3.13 %
Brady Statistic RA Percent Paced: 88.36 %
Brady Statistic RV Percent Paced: 78.99 %
Date Time Interrogation Session: 20211029102406
Implantable Lead Implant Date: 20160524
Implantable Lead Implant Date: 20160524
Implantable Lead Location: 753859
Implantable Lead Location: 753860
Implantable Lead Model: 5076
Implantable Lead Model: 5076
Implantable Pulse Generator Implant Date: 20160524
Lead Channel Impedance Value: 361 Ohm
Lead Channel Impedance Value: 437 Ohm
Lead Channel Impedance Value: 456 Ohm
Lead Channel Impedance Value: 532 Ohm
Lead Channel Pacing Threshold Amplitude: 0.625 V
Lead Channel Pacing Threshold Amplitude: 0.875 V
Lead Channel Pacing Threshold Pulse Width: 0.4 ms
Lead Channel Pacing Threshold Pulse Width: 0.4 ms
Lead Channel Sensing Intrinsic Amplitude: 1.625 mV
Lead Channel Sensing Intrinsic Amplitude: 1.625 mV
Lead Channel Sensing Intrinsic Amplitude: 16.25 mV
Lead Channel Sensing Intrinsic Amplitude: 16.25 mV
Lead Channel Setting Pacing Amplitude: 1.25 V
Lead Channel Setting Pacing Amplitude: 2 V
Lead Channel Setting Pacing Pulse Width: 0.4 ms
Lead Channel Setting Sensing Sensitivity: 0.9 mV

## 2020-05-19 NOTE — Progress Notes (Signed)
Remote pacemaker transmission.   

## 2020-05-26 ENCOUNTER — Telehealth: Payer: Self-pay | Admitting: Cardiology

## 2020-05-26 NOTE — Telephone Encounter (Signed)
Spoke with Dr. Lessie Dings just now and verified the patients BUN and creatinine that we had on file with her. She verbalizes understanding and thanks me for the call back.

## 2020-05-26 NOTE — Telephone Encounter (Signed)
Dr. Lessie Dings would like to verify lab results

## 2020-06-09 DIAGNOSIS — L988 Other specified disorders of the skin and subcutaneous tissue: Secondary | ICD-10-CM | POA: Diagnosis not present

## 2020-07-16 ENCOUNTER — Other Ambulatory Visit: Payer: Self-pay

## 2020-07-16 ENCOUNTER — Other Ambulatory Visit: Payer: Self-pay | Admitting: *Deleted

## 2020-07-16 ENCOUNTER — Ambulatory Visit (INDEPENDENT_AMBULATORY_CARE_PROVIDER_SITE_OTHER): Payer: Medicare Other | Admitting: Podiatry

## 2020-07-16 ENCOUNTER — Encounter: Payer: Self-pay | Admitting: Podiatry

## 2020-07-16 DIAGNOSIS — M79674 Pain in right toe(s): Secondary | ICD-10-CM

## 2020-07-16 DIAGNOSIS — D689 Coagulation defect, unspecified: Secondary | ICD-10-CM

## 2020-07-16 DIAGNOSIS — B351 Tinea unguium: Secondary | ICD-10-CM | POA: Diagnosis not present

## 2020-07-16 DIAGNOSIS — M79675 Pain in left toe(s): Secondary | ICD-10-CM

## 2020-07-16 DIAGNOSIS — G629 Polyneuropathy, unspecified: Secondary | ICD-10-CM

## 2020-07-17 NOTE — Progress Notes (Signed)
Subjective:  Patient ID: Erik Brock, male    DOB: May 15, 1928,  MRN: [id number]  86 y.o. male presents with at risk foot care with history of peripheral neuropathy and painful thick toenails that are difficult to trim. Pain interferes with ambulation. Aggravating factors include wearing enclosed shoe gear. Pain is relieved with periodic professional debridement.   He states he will be moving into an assisted living facility soon. He voices no new pedal concerns on today's visit.  Review of Systems: Negative except as noted in the HPI.  Past Medical History:  Diagnosis Date  . Ascending aortic aneurysm ([place]) 09/05/2017  . 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 Bleeding nose   . CAD (coronary artery disease) 12/08/2014   Overview:  Mild, nonobsructive  Overview:  Mild, nonobsructive  . Cardiac pacemaker in situ 12/22/2014  . Cardiomyopathy ([place]) 09/05/2017  . Chronic diastolic heart failure ([place]) 04/05/2015  . Dyslipidemia 04/05/2015  . Elevated PSA 07/30/2013  . Essential (primary) hypertension 07/30/2013  . GERD (gastroesophageal reflux disease) 07/30/2013  . Gonalgia 11/11/2014  . Hyperlipidemia 07/30/2013  . Hypertension   . Hypertensive heart disease with heart failure ([place]) 04/05/2015  . LBBB (left bundle branch block) 12/08/2014  . Lumbar stenosis 07/30/2013  . Neuritis or radiculitis due to rupture of lumbar intervertebral disc 11/11/2014  . Pacemaker reprogramming/check 12/22/2014  . Thyroid disease   . Trigger point of right shoulder region 10/14/2014   Past Surgical History:  Procedure Laterality Date  . APPENDECTOMY    . INSERT / REPLACE / REMOVE PACEMAKER    . tonsills     Patient Active Problem List   Diagnosis Date Noted  . Thyroid disease   . Hypertension   . Bleeding nose   . Thoracic spondylosis 06/10/2019  . Ascending aortic aneurysm ([place]) 09/05/2017  . Cardiomyopathy ([place]) 09/05/2017  . Chronic diastolic heart failure ([place]) 04/05/2015  . Dyslipidemia 04/05/2015  .  Hypertensive heart disease with heart failure ([place]) 04/05/2015  . Cardiac pacemaker in situ 12/22/2014  . Pacemaker reprogramming/check 12/22/2014  . 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 CAD (coronary artery disease) 12/08/2014  . LBBB (left bundle branch block) 12/08/2014  . Gonalgia 11/11/2014  . Neuritis or radiculitis due to rupture of lumbar intervertebral disc 11/11/2014  . Trigger point of right shoulder region 10/14/2014  . Elevated PSA 07/30/2013  . GERD (gastroesophageal reflux disease) 07/30/2013  . Hyperlipidemia 07/30/2013  . Essential (primary) hypertension 07/30/2013  . Lumbar stenosis 07/30/2013    Current Outpatient Medications:  .  alendronate (FOSAMAX) 70 MG tablet, TAKE 1 TABLET BY MOUTH ONCE A WEEK, Disp: , Rfl:  .  aspirin 81 MG EC tablet, Take 81 mg by mouth daily. Half tablet daily, Disp: , Rfl:  .  finasteride (PROSCAR) 5 MG tablet, Take 5 mg by mouth daily. , Disp: , Rfl:  .  gabapentin (NEURONTIN) 300 MG capsule, Take 300 mg by mouth at bedtime., Disp: , Rfl:  .  levothyroxine (SYNTHROID, LEVOTHROID) 50 MCG tablet, Take 50 mcg by mouth daily before breakfast., Disp: , Rfl:  .  lisinopril (ZESTRIL) 20 MG tablet, Take 20 mg by mouth daily., Disp: , Rfl:  .  lisinopril-hydrochlorothiazide (ZESTORETIC) 20-25 MG tablet, Take 1 tablet by mouth 2 (two) times daily., Disp: 90 tablet, Rfl: 1 .  metoprolol tartrate (LOPRESSOR) 50 MG tablet, Take 1 tablet (50 mg total) by mouth 2 (two) times daily., Disp: 180 tablet, Rfl: 2 .  omeprazole (PRILOSEC) 20 MG capsule, Take 20 mg  by mouth daily., Disp: , Rfl:  .  simvastatin (ZOCOR) 20 MG tablet, Take 20 mg by mouth daily., Disp: , Rfl:  .  terazosin (HYTRIN) 10 MG capsule, Take 10 mg by mouth daily. , Disp: , Rfl:  .  tiZANidine (ZANAFLEX) 4 MG tablet, Take 2 mg by mouth daily as needed., Disp: , Rfl:  .  Vitamin D, Ergocalciferol, (DRISDOL) 1.25 MG (50000 UT) CAPS capsule, Take 50,000 Units by mouth once a week., Disp: , Rfl:   No Known Allergies Social History   Occupational History  . Not on file  Tobacco Use  . Smoking status: Never Smoker  . Smokeless tobacco: Never Used  Vaping Use  . Vaping Use: Never used  Substance and Sexual Activity  . Alcohol use: No  . Drug use: No  . Sexual activity: Not on file    Objective:   Constitutional Pt is a pleasant 86 y.o. Caucasian male in NAD.Marland Kitchen AAO x 3.   Vascular Capillary refill time to remaining digits <3 seconds. Faintly palpable DP pulse(s) b/l lower extremities. Nonpalpable PT pulse(s) b/l lower extremities. Pedal hair sparse. Lower extremity skin temperature gradient within normal limits. Varicosities present b/l. No cyanosis or clubbing noted.  Neurologic Normal speech. Oriented to person, place, and time. Protective sensation intact 5/5 intact bilaterally with 10g monofilament b/l.  Dermatologic Pedal skin with normal turgor, texture and tone bilaterally. No open wounds bilaterally. No interdigital macerations bilaterally. Toenails 1-5 b/l elongated, discolored, dystrophic, thickened, crumbly with subungual debris and tenderness to dorsal palpation.  Orthopedic: Normal muscle strength 5/5 to all lower extremity muscle groups bilaterally. No pain crepitus or joint limitation noted with ROM b/l. Hammertoes noted to the b/l lower extremities. Utilizes rollator for ambulation assistance.   Radiographs: None Assessment:   1. Pain due to onychomycosis of toenails of both feet   2. Neuropathy   3. Coagulopathy ([place])    Plan:  Patient was evaluated and treated and all questions answered.  Onychomycosis with pain -Nails palliatively debridement as below. -Educated on self-care  Procedure: Nail Debridement Rationale: Pain Type of Debridement: manual, sharp debridement. Instrumentation: Nail nipper, rotary burr. Number of Nails: 10  -Examined patient. -No new findings. No new orders. -Toenails 1-5 b/l were debrided in length and girth with sterile nail  nippers and dremel without iatrogenic bleeding.  -Patient to report any pedal injuries to medical professional immediately. -Patient to continue soft, supportive shoe gear daily. -Patient/POA to call should there be question/concern in the interim.  Return in about 3 months (around 10/14/2020).  Marzetta Board, DPM

## 2020-07-20 DIAGNOSIS — E782 Mixed hyperlipidemia: Secondary | ICD-10-CM | POA: Diagnosis not present

## 2020-07-20 DIAGNOSIS — R296 Repeated falls: Secondary | ICD-10-CM | POA: Diagnosis not present

## 2020-07-20 DIAGNOSIS — N400 Enlarged prostate without lower urinary tract symptoms: Secondary | ICD-10-CM | POA: Diagnosis not present

## 2020-07-20 DIAGNOSIS — I10 Essential (primary) hypertension: Secondary | ICD-10-CM | POA: Diagnosis not present

## 2020-07-20 DIAGNOSIS — E559 Vitamin D deficiency, unspecified: Secondary | ICD-10-CM | POA: Diagnosis not present

## 2020-07-20 DIAGNOSIS — K219 Gastro-esophageal reflux disease without esophagitis: Secondary | ICD-10-CM | POA: Diagnosis not present

## 2020-07-20 DIAGNOSIS — Z950 Presence of cardiac pacemaker: Secondary | ICD-10-CM | POA: Diagnosis not present

## 2020-07-20 DIAGNOSIS — I2510 Atherosclerotic heart disease of native coronary artery without angina pectoris: Secondary | ICD-10-CM | POA: Diagnosis not present

## 2020-07-20 DIAGNOSIS — M810 Age-related osteoporosis without current pathological fracture: Secondary | ICD-10-CM | POA: Diagnosis not present

## 2020-07-20 DIAGNOSIS — M545 Low back pain, unspecified: Secondary | ICD-10-CM | POA: Diagnosis not present

## 2020-07-23 DIAGNOSIS — M47816 Spondylosis without myelopathy or radiculopathy, lumbar region: Secondary | ICD-10-CM | POA: Diagnosis not present

## 2020-07-27 DIAGNOSIS — Z20828 Contact with and (suspected) exposure to other viral communicable diseases: Secondary | ICD-10-CM | POA: Diagnosis not present

## 2020-08-04 DIAGNOSIS — Z20828 Contact with and (suspected) exposure to other viral communicable diseases: Secondary | ICD-10-CM | POA: Diagnosis not present

## 2020-08-11 DIAGNOSIS — M47816 Spondylosis without myelopathy or radiculopathy, lumbar region: Secondary | ICD-10-CM | POA: Diagnosis not present

## 2020-08-11 DIAGNOSIS — Z20828 Contact with and (suspected) exposure to other viral communicable diseases: Secondary | ICD-10-CM | POA: Diagnosis not present

## 2020-08-13 ENCOUNTER — Ambulatory Visit (INDEPENDENT_AMBULATORY_CARE_PROVIDER_SITE_OTHER): Payer: Medicare Other

## 2020-08-13 DIAGNOSIS — I441 Atrioventricular block, second degree: Secondary | ICD-10-CM

## 2020-08-14 LAB — CUP PACEART REMOTE DEVICE CHECK
Battery Remaining Longevity: 60 mo
Battery Voltage: 2.99 V
Brady Statistic AP VP Percent: 5.46 %
Brady Statistic AP VS Percent: 66.68 %
Brady Statistic AS VP Percent: 0.22 %
Brady Statistic AS VS Percent: 27.64 %
Brady Statistic RA Percent Paced: 67.2 %
Brady Statistic RV Percent Paced: 5.68 %
Date Time Interrogation Session: 20220128112003
Implantable Lead Implant Date: 20160524
Implantable Lead Implant Date: 20160524
Implantable Lead Location: 753859
Implantable Lead Location: 753860
Implantable Lead Model: 5076
Implantable Lead Model: 5076
Implantable Pulse Generator Implant Date: 20160524
Lead Channel Impedance Value: 380 Ohm
Lead Channel Impedance Value: 456 Ohm
Lead Channel Impedance Value: 475 Ohm
Lead Channel Impedance Value: 551 Ohm
Lead Channel Pacing Threshold Amplitude: 0.625 V
Lead Channel Pacing Threshold Amplitude: 0.75 V
Lead Channel Pacing Threshold Pulse Width: 0.4 ms
Lead Channel Pacing Threshold Pulse Width: 0.4 ms
Lead Channel Sensing Intrinsic Amplitude: 1.625 mV
Lead Channel Sensing Intrinsic Amplitude: 1.625 mV
Lead Channel Sensing Intrinsic Amplitude: 20 mV
Lead Channel Sensing Intrinsic Amplitude: 20 mV
Lead Channel Setting Pacing Amplitude: 1.25 V
Lead Channel Setting Pacing Amplitude: 2 V
Lead Channel Setting Pacing Pulse Width: 0.4 ms
Lead Channel Setting Sensing Sensitivity: 0.9 mV

## 2020-08-17 DIAGNOSIS — I129 Hypertensive chronic kidney disease with stage 1 through stage 4 chronic kidney disease, or unspecified chronic kidney disease: Secondary | ICD-10-CM | POA: Diagnosis not present

## 2020-08-18 DIAGNOSIS — Z20828 Contact with and (suspected) exposure to other viral communicable diseases: Secondary | ICD-10-CM | POA: Diagnosis not present

## 2020-08-23 NOTE — Progress Notes (Signed)
Remote pacemaker transmission.   

## 2020-08-26 DIAGNOSIS — Z20828 Contact with and (suspected) exposure to other viral communicable diseases: Secondary | ICD-10-CM | POA: Diagnosis not present

## 2020-09-02 DIAGNOSIS — Z9181 History of falling: Secondary | ICD-10-CM | POA: Diagnosis not present

## 2020-09-02 DIAGNOSIS — Z20828 Contact with and (suspected) exposure to other viral communicable diseases: Secondary | ICD-10-CM | POA: Diagnosis not present

## 2020-09-02 DIAGNOSIS — W06XXXA Fall from bed, initial encounter: Secondary | ICD-10-CM | POA: Diagnosis not present

## 2020-09-02 DIAGNOSIS — R2689 Other abnormalities of gait and mobility: Secondary | ICD-10-CM | POA: Diagnosis not present

## 2020-09-02 DIAGNOSIS — R269 Unspecified abnormalities of gait and mobility: Secondary | ICD-10-CM | POA: Diagnosis not present

## 2020-09-02 DIAGNOSIS — R54 Age-related physical debility: Secondary | ICD-10-CM | POA: Diagnosis not present

## 2020-09-02 DIAGNOSIS — M6281 Muscle weakness (generalized): Secondary | ICD-10-CM | POA: Diagnosis not present

## 2020-09-07 DIAGNOSIS — R269 Unspecified abnormalities of gait and mobility: Secondary | ICD-10-CM | POA: Diagnosis not present

## 2020-09-07 DIAGNOSIS — M62830 Muscle spasm of back: Secondary | ICD-10-CM | POA: Diagnosis not present

## 2020-09-07 DIAGNOSIS — M6281 Muscle weakness (generalized): Secondary | ICD-10-CM | POA: Diagnosis not present

## 2020-09-07 DIAGNOSIS — G894 Chronic pain syndrome: Secondary | ICD-10-CM | POA: Diagnosis not present

## 2020-09-07 DIAGNOSIS — M545 Low back pain, unspecified: Secondary | ICD-10-CM | POA: Diagnosis not present

## 2020-09-07 DIAGNOSIS — R54 Age-related physical debility: Secondary | ICD-10-CM | POA: Diagnosis not present

## 2020-09-14 DIAGNOSIS — M546 Pain in thoracic spine: Secondary | ICD-10-CM | POA: Diagnosis not present

## 2020-09-14 DIAGNOSIS — G894 Chronic pain syndrome: Secondary | ICD-10-CM | POA: Diagnosis not present

## 2020-09-14 DIAGNOSIS — M7918 Myalgia, other site: Secondary | ICD-10-CM | POA: Diagnosis not present

## 2020-09-14 DIAGNOSIS — R54 Age-related physical debility: Secondary | ICD-10-CM | POA: Diagnosis not present

## 2020-09-14 DIAGNOSIS — M47816 Spondylosis without myelopathy or radiculopathy, lumbar region: Secondary | ICD-10-CM | POA: Diagnosis not present

## 2020-09-14 DIAGNOSIS — M62830 Muscle spasm of back: Secondary | ICD-10-CM | POA: Diagnosis not present

## 2020-09-14 DIAGNOSIS — M545 Low back pain, unspecified: Secondary | ICD-10-CM | POA: Diagnosis not present

## 2020-09-14 DIAGNOSIS — M47814 Spondylosis without myelopathy or radiculopathy, thoracic region: Secondary | ICD-10-CM | POA: Diagnosis not present

## 2020-09-14 DIAGNOSIS — M1990 Unspecified osteoarthritis, unspecified site: Secondary | ICD-10-CM | POA: Diagnosis not present

## 2020-09-17 DIAGNOSIS — R269 Unspecified abnormalities of gait and mobility: Secondary | ICD-10-CM | POA: Diagnosis not present

## 2020-09-17 DIAGNOSIS — R54 Age-related physical debility: Secondary | ICD-10-CM | POA: Diagnosis not present

## 2020-09-17 DIAGNOSIS — R600 Localized edema: Secondary | ICD-10-CM | POA: Diagnosis not present

## 2020-09-17 DIAGNOSIS — M6281 Muscle weakness (generalized): Secondary | ICD-10-CM | POA: Diagnosis not present

## 2020-09-18 DIAGNOSIS — R5381 Other malaise: Secondary | ICD-10-CM | POA: Diagnosis not present

## 2020-09-18 DIAGNOSIS — R54 Age-related physical debility: Secondary | ICD-10-CM | POA: Diagnosis not present

## 2020-09-18 DIAGNOSIS — G4700 Insomnia, unspecified: Secondary | ICD-10-CM | POA: Diagnosis not present

## 2020-09-29 ENCOUNTER — Encounter (HOSPITAL_COMMUNITY): Payer: Self-pay | Admitting: Internal Medicine

## 2020-09-29 ENCOUNTER — Inpatient Hospital Stay (HOSPITAL_COMMUNITY)
Admission: AD | Admit: 2020-09-29 | Discharge: 2020-10-02 | DRG: 065 | Disposition: A | Discharge status: Skilled Nursing Facility | Payer: Medicare Other | Source: Skilled Nursing Facility | Attending: Internal Medicine | Admitting: Internal Medicine

## 2020-09-29 DIAGNOSIS — Z7983 Long term (current) use of bisphosphonates: Secondary | ICD-10-CM | POA: Diagnosis not present

## 2020-09-29 DIAGNOSIS — I517 Cardiomegaly: Secondary | ICD-10-CM | POA: Diagnosis not present

## 2020-09-29 DIAGNOSIS — I10 Essential (primary) hypertension: Secondary | ICD-10-CM | POA: Diagnosis not present

## 2020-09-29 DIAGNOSIS — Z950 Presence of cardiac pacemaker: Secondary | ICD-10-CM | POA: Diagnosis not present

## 2020-09-29 DIAGNOSIS — Z20822 Contact with and (suspected) exposure to covid-19: Secondary | ICD-10-CM | POA: Diagnosis present

## 2020-09-29 DIAGNOSIS — M5416 Radiculopathy, lumbar region: Secondary | ICD-10-CM | POA: Diagnosis not present

## 2020-09-29 DIAGNOSIS — M5410 Radiculopathy, site unspecified: Secondary | ICD-10-CM

## 2020-09-29 DIAGNOSIS — R29702 NIHSS score 2: Secondary | ICD-10-CM | POA: Diagnosis present

## 2020-09-29 DIAGNOSIS — Z7982 Long term (current) use of aspirin: Secondary | ICD-10-CM | POA: Diagnosis not present

## 2020-09-29 DIAGNOSIS — Z79899 Other long term (current) drug therapy: Secondary | ICD-10-CM | POA: Diagnosis not present

## 2020-09-29 DIAGNOSIS — G8929 Other chronic pain: Secondary | ICD-10-CM | POA: Diagnosis present

## 2020-09-29 DIAGNOSIS — I429 Cardiomyopathy, unspecified: Secondary | ICD-10-CM | POA: Diagnosis present

## 2020-09-29 DIAGNOSIS — R0902 Hypoxemia: Secondary | ICD-10-CM | POA: Diagnosis not present

## 2020-09-29 DIAGNOSIS — I739 Peripheral vascular disease, unspecified: Secondary | ICD-10-CM | POA: Diagnosis not present

## 2020-09-29 DIAGNOSIS — E785 Hyperlipidemia, unspecified: Secondary | ICD-10-CM | POA: Diagnosis present

## 2020-09-29 DIAGNOSIS — I639 Cerebral infarction, unspecified: Secondary | ICD-10-CM | POA: Diagnosis not present

## 2020-09-29 DIAGNOSIS — I63311 Cerebral infarction due to thrombosis of right middle cerebral artery: Secondary | ICD-10-CM

## 2020-09-29 DIAGNOSIS — I6389 Other cerebral infarction: Secondary | ICD-10-CM | POA: Diagnosis not present

## 2020-09-29 DIAGNOSIS — I495 Sick sinus syndrome: Secondary | ICD-10-CM | POA: Diagnosis not present

## 2020-09-29 DIAGNOSIS — I5032 Chronic diastolic (congestive) heart failure: Secondary | ICD-10-CM | POA: Diagnosis present

## 2020-09-29 DIAGNOSIS — I110 Hypertensive heart disease with heart failure: Secondary | ICD-10-CM | POA: Diagnosis not present

## 2020-09-29 DIAGNOSIS — R29700 NIHSS score 0: Secondary | ICD-10-CM | POA: Diagnosis not present

## 2020-09-29 DIAGNOSIS — E039 Hypothyroidism, unspecified: Secondary | ICD-10-CM | POA: Diagnosis not present

## 2020-09-29 DIAGNOSIS — R270 Ataxia, unspecified: Secondary | ICD-10-CM | POA: Diagnosis not present

## 2020-09-29 DIAGNOSIS — I63511 Cerebral infarction due to unspecified occlusion or stenosis of right middle cerebral artery: Principal | ICD-10-CM | POA: Diagnosis present

## 2020-09-29 DIAGNOSIS — I351 Nonrheumatic aortic (valve) insufficiency: Secondary | ICD-10-CM | POA: Diagnosis not present

## 2020-09-29 DIAGNOSIS — Z79890 Hormone replacement therapy (postmenopausal): Secondary | ICD-10-CM

## 2020-09-29 DIAGNOSIS — N184 Chronic kidney disease, stage 4 (severe): Secondary | ICD-10-CM | POA: Diagnosis not present

## 2020-09-29 DIAGNOSIS — I1310 Hypertensive heart and chronic kidney disease without heart failure, with stage 1 through stage 4 chronic kidney disease, or unspecified chronic kidney disease: Secondary | ICD-10-CM | POA: Diagnosis present

## 2020-09-29 DIAGNOSIS — K219 Gastro-esophageal reflux disease without esophagitis: Secondary | ICD-10-CM | POA: Diagnosis present

## 2020-09-29 DIAGNOSIS — I160 Hypertensive urgency: Secondary | ICD-10-CM | POA: Diagnosis not present

## 2020-09-29 DIAGNOSIS — W19XXXA Unspecified fall, initial encounter: Secondary | ICD-10-CM | POA: Diagnosis not present

## 2020-09-29 DIAGNOSIS — G8194 Hemiplegia, unspecified affecting left nondominant side: Secondary | ICD-10-CM | POA: Diagnosis present

## 2020-09-29 DIAGNOSIS — I672 Cerebral atherosclerosis: Secondary | ICD-10-CM | POA: Diagnosis not present

## 2020-09-29 DIAGNOSIS — Z9181 History of falling: Secondary | ICD-10-CM | POA: Diagnosis not present

## 2020-09-29 DIAGNOSIS — R531 Weakness: Secondary | ICD-10-CM | POA: Diagnosis not present

## 2020-09-29 DIAGNOSIS — N183 Chronic kidney disease, stage 3 unspecified: Secondary | ICD-10-CM | POA: Diagnosis present

## 2020-09-29 DIAGNOSIS — I2510 Atherosclerotic heart disease of native coronary artery without angina pectoris: Secondary | ICD-10-CM | POA: Diagnosis present

## 2020-09-29 DIAGNOSIS — G319 Degenerative disease of nervous system, unspecified: Secondary | ICD-10-CM | POA: Diagnosis not present

## 2020-09-29 DIAGNOSIS — R29898 Other symptoms and signs involving the musculoskeletal system: Secondary | ICD-10-CM

## 2020-09-29 DIAGNOSIS — M542 Cervicalgia: Secondary | ICD-10-CM | POA: Diagnosis present

## 2020-09-29 DIAGNOSIS — Z751 Person awaiting admission to adequate facility elsewhere: Secondary | ICD-10-CM | POA: Diagnosis not present

## 2020-09-29 DIAGNOSIS — N400 Enlarged prostate without lower urinary tract symptoms: Secondary | ICD-10-CM | POA: Diagnosis present

## 2020-09-29 DIAGNOSIS — E7800 Pure hypercholesterolemia, unspecified: Secondary | ICD-10-CM | POA: Diagnosis present

## 2020-09-29 MED ORDER — ASPIRIN EC 81 MG PO TBEC
81.0000 mg | DELAYED_RELEASE_TABLET | Freq: Every day | ORAL | Status: DC
  Administered 2020-09-30 – 2020-10-02 (×3): 81 mg via ORAL
  Filled 2020-09-29 (×3): qty 1

## 2020-09-29 MED ORDER — SODIUM CHLORIDE 0.9 % IV SOLN: INTRAVENOUS | Status: DC

## 2020-09-29 MED ORDER — TIZANIDINE HCL 4 MG PO TABS: 2.0000 mg | ORAL_TABLET | Freq: Every day | ORAL | Status: DC | PRN

## 2020-09-29 MED ORDER — FINASTERIDE 5 MG PO TABS
5.0000 mg | ORAL_TABLET | Freq: Every day | ORAL | Status: DC
  Administered 2020-09-30 – 2020-10-02 (×3): 5 mg via ORAL
  Filled 2020-09-29 (×3): qty 1

## 2020-09-29 MED ORDER — ACETAMINOPHEN 160 MG/5ML PO SOLN: 650.0000 mg | ORAL | Status: DC | PRN

## 2020-09-29 MED ORDER — PANTOPRAZOLE SODIUM 40 MG PO TBEC
40.0000 mg | DELAYED_RELEASE_TABLET | Freq: Every day | ORAL | Status: DC
  Administered 2020-09-30 – 2020-10-02 (×3): 40 mg via ORAL
  Filled 2020-09-29 (×3): qty 1

## 2020-09-29 MED ORDER — SIMVASTATIN 20 MG PO TABS
20.0000 mg | ORAL_TABLET | Freq: Every day | ORAL | Status: DC
  Administered 2020-09-30 – 2020-10-02 (×3): 20 mg via ORAL
  Filled 2020-09-29 (×3): qty 1

## 2020-09-29 MED ORDER — STROKE: EARLY STAGES OF RECOVERY BOOK
Freq: Once | Status: AC
  Filled 2020-09-29: qty 1

## 2020-09-29 MED ORDER — ACETAMINOPHEN 325 MG PO TABS: 650.0000 mg | ORAL_TABLET | ORAL | Status: DC | PRN

## 2020-09-29 MED ORDER — LEVOTHYROXINE SODIUM 50 MCG PO TABS
50.0000 ug | ORAL_TABLET | Freq: Every day | ORAL | Status: DC
  Administered 2020-09-30 – 2020-10-02 (×3): 50 ug via ORAL
  Filled 2020-09-29 (×3): qty 1

## 2020-09-29 MED ORDER — ACETAMINOPHEN 650 MG RE SUPP: 650.0000 mg | RECTAL | Status: DC | PRN

## 2020-09-29 MED ORDER — SENNOSIDES-DOCUSATE SODIUM 8.6-50 MG PO TABS: 1.0000 | ORAL_TABLET | Freq: Every evening | ORAL | Status: DC | PRN

## 2020-09-29 MED ORDER — LISINOPRIL-HYDROCHLOROTHIAZIDE 20-25 MG PO TABS: 1.0000 | ORAL_TABLET | Freq: Two times a day (BID) | ORAL | Status: DC

## 2020-09-29 MED ORDER — GABAPENTIN 300 MG PO CAPS
300.0000 mg | ORAL_CAPSULE | Freq: Every day | ORAL | Status: DC
  Administered 2020-09-29 – 2020-10-01 (×3): 300 mg via ORAL
  Filled 2020-09-29 (×3): qty 1

## 2020-09-29 NOTE — Progress Notes (Signed)
Pt has been to the unit/ All belongings are at bedside and pt has all equipment present as well. Pt has telephone and call light in hand.  09/29/20 1845  Vitals  Temp 98.3 F (36.8 C)  Temp Source Oral  BP (!) 153/83  MAP (mmHg) 106  BP Location Right Arm  BP Method Automatic  Patient Position (if appropriate) Lying  Pulse Rate 80  Pulse Rate Source Dinamap  Resp 18  Level of Consciousness  Level of Consciousness Alert  MEWS COLOR  MEWS Score Color Green  Oxygen Therapy  SpO2 98 %  O2 Device Room Air  Patient Activity (if Appropriate) In bed  Pulse Oximetry Type Intermittent  Pain Assessment  Pain Scale 0-10  Pain Score 0  MEWS Score  MEWS Temp 0  MEWS Systolic 0  MEWS Pulse 0  MEWS RR 0  MEWS LOC 0  MEWS Score 0

## 2020-09-29 NOTE — Progress Notes (Signed)
Transfer from [facility] due to left sided weakness. All CT and lab results are in pt chart. He had a negative COVID test result which is also in his chart.  Denies dizziness, shortness of breath, chest pain. Speech clear . See flowsheet for full shift assessment. Continuing to monitor.  Dr Chotiner came to bedside to assess patient.  

## 2020-09-29 NOTE — H&P (Signed)
History and Physical    Erik Brock XBJ:[id number] DOB: [birth year] DOA: 09/29/2020  PCP: Mateo Flow, MD   Patient coming from:  Assisted Living via [facility].  Chief Complaint: Left leg weakness, difficulty walking  HPI: Erik Brock is a 87 y.o. male with medical history significant for HTN, CAD, chronic diastolic CHF, hypothyroidism, s/p pacemaker, HLD, OA, DDD who presents in transfer from [facility] for evaluation of suspected CVA.  [name] reports that he woke up this morning and had weakness in his left leg.  He states he had difficulty raising it off the bed and when he tried to get into the bathroom he was off balance and had difficulty walking secondary to weakness.  He states he almost fell twice going to the bathroom.  He denies any loss of consciousness or seizure-like activities.  He states he did not have any numbness in his leg.  He states his right leg and both his arms felt normal.  He denies having any slurred speech or drooping face.  He did not have any visual change.  He has had multiple falls in the past which led to him moving into an assisted living facility in December 2021.  He denies having any headache.  He has not had any chest pain, shortness of breath, palpitations, cough, fever, chills, urinary frequency or dysuria.  When he arrived to the emergency room for evaluation he was found to have blood pressures elevated above 200/100.  He was seen by telemetry neurology recommended stroke work-up with MRI of the brain and also MRI of the C-spine and L-spine for symptoms of radiculopathy.  MRI could not be obtained at [facility] secondary to patient's pacemaker so he was accepted in transfer to [facility].   ED Course: His initial blood pressure was greater than 200/100.  When patient arrived at [facility] blood pressure was 153/83.  Lab work from the hospital was unremarkable with a creatinine of 1.2 glucose of 98 sodium 139 potassium 4.6.   The head at [facility] showed no acute hemorrhage or pathology.  Patient did have chronic changes.  Review of Systems:  General: Denies fever, chills, weight loss, night sweats.  Denies dizziness.  Denies change in appetite HENT: Denies head trauma, headache, denies change in hearing, tinnitus.  Denies nasal bleeding.  Denies sore throat, sores in mouth.  Denies difficulty swallowing Eyes: Denies blurry vision, pain in eye, drainage.  Denies discoloration of eyes. Neck: Denies pain.  Denies swelling.  Denies pain with movement. Cardiovascular: Denies chest pain, palpitations.  Denies edema.  Denies orthopnea Respiratory: Denies shortness of breath, cough.  Denies wheezing.  Denies sputum production Gastrointestinal: Denies abdominal pain, swelling.  Denies nausea, vomiting, diarrhea.  Denies melena.  Denies hematemesis. Musculoskeletal: Denies limitation of movement.  Denies deformity or swelling.  Has chronic upper back pain and low back pain. Genitourinary: Denies pelvic pain.  Denies urinary frequency or hesitancy.  Denies dysuria.  Skin: Denies rash.  Denies petechiae, purpura, ecchymosis. Neurological: Denies headache.  Denies syncope  Denies seizure activity. Denies paresthesia. Denies slurred speech, drooping face.  Denies visual change. Psychiatric: Denies depression, anxiety. Denies hallucinations.  Past Medical History:  Diagnosis Date  . Ascending aortic aneurysm ([place]) 09/05/2017  . 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 Bleeding nose   . CAD (coronary artery disease) 12/08/2014   Overview:  Mild, nonobsructive  Overview:  Mild, nonobsructive  . Cardiac pacemaker in situ 12/22/2014  . Cardiomyopathy ([place]) 09/05/2017  .  Chronic diastolic heart failure ([place]) 04/05/2015  . Dyslipidemia 04/05/2015  . Elevated PSA 07/30/2013  . Essential (primary) hypertension 07/30/2013  . GERD (gastroesophageal reflux disease) 07/30/2013  . Gonalgia 11/11/2014  . Hyperlipidemia 07/30/2013  . Hypertension   .  Hypertensive heart disease with heart failure ([place]) 04/05/2015  . LBBB (left bundle branch block) 12/08/2014  . Lumbar stenosis 07/30/2013  . Neuritis or radiculitis due to rupture of lumbar intervertebral disc 11/11/2014  . Pacemaker reprogramming/check 12/22/2014  . Thyroid disease   . Trigger point of right shoulder region 10/14/2014    Past Surgical History:  Procedure Laterality Date  . APPENDECTOMY    . INSERT / REPLACE / REMOVE PACEMAKER    . tonsills      Social History  reports that he has never smoked. He has never used smokeless tobacco. He reports that he does not drink alcohol and does not use drugs.  No Known Allergies  Family History  Problem Relation Age of Onset  . Brain cancer Father      Prior to Admission medications   Medication Sig Start Date End Date Taking? Authorizing Provider  alendronate (FOSAMAX) 70 MG tablet TAKE 1 TABLET BY MOUTH ONCE A WEEK 04/16/19   [provider]  aspirin 81 MG EC tablet Take 81 mg by mouth daily. Half tablet daily    [provider]  finasteride (PROSCAR) 5 MG tablet Take 5 mg by mouth daily.     [provider]  gabapentin (NEURONTIN) 300 MG capsule Take 300 mg by mouth at bedtime. 11/12/18   [provider]  levothyroxine (SYNTHROID, LEVOTHROID) 50 MCG tablet Take 50 mcg by mouth daily before breakfast.    [provider]  lisinopril (ZESTRIL) 20 MG tablet Take 20 mg by mouth daily. 06/22/20   [provider]  lisinopril-hydrochlorothiazide (ZESTORETIC) 20-25 MG tablet Take 1 tablet by mouth 2 (two) times daily. 02/18/19   Richardo Priest, MD  metoprolol tartrate (LOPRESSOR) 50 MG tablet Take 1 tablet (50 mg total) by mouth 2 (two) times daily. 02/14/20 05/14/20  Camnitz, Ocie Doyne, MD  omeprazole (PRILOSEC) 20 MG capsule Take 20 mg by mouth daily.    [provider]  simvastatin (ZOCOR) 20 MG tablet Take 20 mg by mouth daily.    [provider]  terazosin  (HYTRIN) 10 MG capsule Take 10 mg by mouth daily.     [provider]  tiZANidine (ZANAFLEX) 4 MG tablet Take 2 mg by mouth daily as needed. 03/28/20   [provider]  Vitamin D, Ergocalciferol, (DRISDOL) 1.25 MG (50000 UT) CAPS capsule Take 50,000 Units by mouth once a week. 12/21/18   [provider]    Physical Exam: Vitals:   09/29/20 1845  BP: (!) 153/83  Pulse: 80  Resp: 18  Temp: 98.3 F (36.8 C)  TempSrc: Oral  SpO2: 98%    Constitutional: NAD, calm, comfortable Vitals:   09/29/20 1845  BP: (!) 153/83  Pulse: 80  Resp: 18  Temp: 98.3 F (36.8 C)  TempSrc: Oral  SpO2: 98%   General: WDWN, Alert and oriented x3.  Eyes: EOMI, PERRL, conjunctivae normal.  Sclera nonicteric HENT:  Bell/AT, external ears normal.  Nares patent without epistasis.  Mucous membranes are moist. Neck: Soft, normal range of motion, supple, no masses, no thyromegaly. Trachea midline Respiratory: clear to auscultation bilaterally, no wheezing, no crackles. Normal respiratory effort. No accessory muscle use.  Cardiovascular: Regular rate and rhythm, no murmurs / rubs /  gallops. No extremity edema. 2+ pedal pulses. No carotid bruits. Pacemaker in left upper chest Abdomen: Soft, no tenderness, nondistended, no rebound or guarding. Obese.  No masses palpated. Bowel sounds normoactive Musculoskeletal: FROM. no cyanosis. No joint deformity upper and lower extremities. Normal muscle tone.  Skin: Warm, dry, intact no rashes, lesions, ulcers. No induration Neurologic: CN 2-12 grossly intact. Normal speech. Sensation intact, patella DTR +1 bilaterally. Strength 5/5 in upper extremities and right lower extremity. Left lower extremity strength is 4/5. Tongue with normal extension without deviation. Psychiatric: Normal judgment and insight.  Normal mood.    Labs on Admission: I have personally reviewed following labs and imaging studies  CBC: No results for input(s): WBC, NEUTROABS,  HGB, HCT, MCV, PLT in the last 168 hours.  Basic Metabolic Panel: No results for input(s): NA, K, CL, CO2, GLUCOSE, BUN, CREATININE, CALCIUM, MG, PHOS in the last 168 hours.  GFR: CrCl cannot be calculated (Patient's most recent lab result is older than the maximum 21 days allowed.).  Liver Function Tests: No results for input(s): AST, ALT, ALKPHOS, BILITOT, PROT, ALBUMIN in the last 168 hours.  Urine analysis: No results found for: COLORURINE, APPEARANCEUR, LABSPEC, PHURINE, GLUCOSEU, HGBUR, BILIRUBINUR, KETONESUR, PROTEINUR, UROBILINOGEN, NITRITE, LEUKOCYTESUR  Radiological Exams on Admission: No results found.  EKG: Independently reviewed.  Telemetry monitoring shows normal sinus paced rhythm  Assessment/Plan Principal Problem:   Hypertensive urgency Mr. Scheff was transferred from [facility] for HTN urgency and suspected CVA.  Initially BP was reported to be over 200/100. BP is improved to 153/83 at presentation here.  Pt is on Lisinopril HCT which will be resumed in am for BP control.  Pt suspected to have CVA and MRI brain pending. [place] not able to do MRI since pt has a pacemaker.   Active Problems:   Left leg weakness Mr. Lohn woke up with left leg weakness this am with no numbness. He almost fell twice going to bathroom. He reports he was off balance with the leg weakness.  DDx includes CVA, radiculopathy.  Was seen by Teleneurology in the ER at [facility] and Teleneurology recommended MRI of the brain stroke. Was transferred to [facility] for stroke work-up as MRI could not be obtained around the hospital secondary to patient's pacemaker  Will evaluate carotids for large vessel occlusion/stenosis. Obtain echocardiogram to evaluate for PFO, wall motion and ejection fraction. Antiplatelet therapy with aspirin daily. Check lipid panel. Continue statin of zocor that pt takes at home Neurochecks per stroke protocol    Radiculopathy Telemetry  neurology recommends MRI of C-spine and L-spine with symptoms of radiculopathy with weakness in the leg and frequent falls. Patient does have history of falling frequently which led to him moving to an assisted living facility in December 2021.    CAD (coronary artery disease) Continue aspirin, lisinopril HCT, statin    Chronic diastolic heart failure No signs of volume overload.  Check Echocardiogram in am    Hypothyroidism Continue levothyroxine    Cardiac pacemaker in situ Stable    DVT prophylaxis: SCDs for DVT prophylaxis until CVA ruled out.  Code Status:   Full code  Family Communication:  Diagnosis and plan discussed with patient and his son who is at the bedside.  Questions were answered.  They agree with plan.  Further recommendations to follow as clinically indicated Disposition Plan:   Patient is from:  Assisted living facility  Anticipated DC to:  Assisted living facility  Anticipated DC date:  Anticipate less  than 2 midnight stay for work-up  Anticipated DC barriers: No barriers to discharge identified at this time   Admission status:  Observation  Eben Burow MD [facility]  How to contact the [facility] Attending or Consulting provider [place] or covering provider during after hours [place], for this patient?   1. Check the care team in [facility] and look for a) attending/consulting TRH provider listed and b) the [facility] team listed 2. Log into [email or web address] and use [place]'s universal password to access. If you do not have the password, please contact the hospital operator. 3. Locate the [facility] provider you are looking for under [facility] and page to a number that you can be directly reached. 4. If you still have difficulty reaching the provider, please page the [facility] (Director on Call) for the Hospitalists listed on amion for assistance.  09/29/2020, 8:08 PM

## 2020-09-30 ENCOUNTER — Observation Stay (HOSPITAL_COMMUNITY): Payer: Medicare Other

## 2020-09-30 ENCOUNTER — Inpatient Hospital Stay (HOSPITAL_COMMUNITY): Payer: Medicare Other

## 2020-09-30 DIAGNOSIS — I160 Hypertensive urgency: Secondary | ICD-10-CM | POA: Diagnosis present

## 2020-09-30 DIAGNOSIS — Z9181 History of falling: Secondary | ICD-10-CM | POA: Diagnosis not present

## 2020-09-30 DIAGNOSIS — Z7983 Long term (current) use of bisphosphonates: Secondary | ICD-10-CM | POA: Diagnosis not present

## 2020-09-30 DIAGNOSIS — Z7982 Long term (current) use of aspirin: Secondary | ICD-10-CM | POA: Diagnosis not present

## 2020-09-30 DIAGNOSIS — N183 Chronic kidney disease, stage 3 unspecified: Secondary | ICD-10-CM | POA: Diagnosis not present

## 2020-09-30 DIAGNOSIS — I2510 Atherosclerotic heart disease of native coronary artery without angina pectoris: Secondary | ICD-10-CM | POA: Diagnosis present

## 2020-09-30 DIAGNOSIS — G319 Degenerative disease of nervous system, unspecified: Secondary | ICD-10-CM | POA: Diagnosis not present

## 2020-09-30 DIAGNOSIS — M4802 Spinal stenosis, cervical region: Secondary | ICD-10-CM | POA: Diagnosis not present

## 2020-09-30 DIAGNOSIS — I639 Cerebral infarction, unspecified: Secondary | ICD-10-CM | POA: Diagnosis present

## 2020-09-30 DIAGNOSIS — I495 Sick sinus syndrome: Secondary | ICD-10-CM | POA: Diagnosis present

## 2020-09-30 DIAGNOSIS — M50121 Cervical disc disorder at C4-C5 level with radiculopathy: Secondary | ICD-10-CM | POA: Diagnosis not present

## 2020-09-30 DIAGNOSIS — R531 Weakness: Secondary | ICD-10-CM | POA: Diagnosis not present

## 2020-09-30 DIAGNOSIS — R29700 NIHSS score 0: Secondary | ICD-10-CM | POA: Diagnosis present

## 2020-09-30 DIAGNOSIS — Z79890 Hormone replacement therapy (postmenopausal): Secondary | ICD-10-CM | POA: Diagnosis not present

## 2020-09-30 DIAGNOSIS — M5126 Other intervertebral disc displacement, lumbar region: Secondary | ICD-10-CM | POA: Diagnosis not present

## 2020-09-30 DIAGNOSIS — Z950 Presence of cardiac pacemaker: Secondary | ICD-10-CM | POA: Diagnosis not present

## 2020-09-30 DIAGNOSIS — J3489 Other specified disorders of nose and nasal sinuses: Secondary | ICD-10-CM | POA: Diagnosis not present

## 2020-09-30 DIAGNOSIS — R2689 Other abnormalities of gait and mobility: Secondary | ICD-10-CM | POA: Diagnosis not present

## 2020-09-30 DIAGNOSIS — I63511 Cerebral infarction due to unspecified occlusion or stenosis of right middle cerebral artery: Secondary | ICD-10-CM | POA: Diagnosis present

## 2020-09-30 DIAGNOSIS — R5381 Other malaise: Secondary | ICD-10-CM | POA: Diagnosis not present

## 2020-09-30 DIAGNOSIS — Z743 Need for continuous supervision: Secondary | ICD-10-CM | POA: Diagnosis not present

## 2020-09-30 DIAGNOSIS — I429 Cardiomyopathy, unspecified: Secondary | ICD-10-CM | POA: Diagnosis present

## 2020-09-30 DIAGNOSIS — I63311 Cerebral infarction due to thrombosis of right middle cerebral artery: Secondary | ICD-10-CM | POA: Diagnosis not present

## 2020-09-30 DIAGNOSIS — E039 Hypothyroidism, unspecified: Secondary | ICD-10-CM | POA: Diagnosis present

## 2020-09-30 DIAGNOSIS — R279 Unspecified lack of coordination: Secondary | ICD-10-CM | POA: Diagnosis not present

## 2020-09-30 DIAGNOSIS — R270 Ataxia, unspecified: Secondary | ICD-10-CM | POA: Diagnosis present

## 2020-09-30 DIAGNOSIS — N400 Enlarged prostate without lower urinary tract symptoms: Secondary | ICD-10-CM | POA: Diagnosis not present

## 2020-09-30 DIAGNOSIS — I6622 Occlusion and stenosis of left posterior cerebral artery: Secondary | ICD-10-CM | POA: Diagnosis not present

## 2020-09-30 DIAGNOSIS — I517 Cardiomegaly: Secondary | ICD-10-CM | POA: Diagnosis not present

## 2020-09-30 DIAGNOSIS — I672 Cerebral atherosclerosis: Secondary | ICD-10-CM | POA: Diagnosis not present

## 2020-09-30 DIAGNOSIS — I739 Peripheral vascular disease, unspecified: Secondary | ICD-10-CM | POA: Diagnosis not present

## 2020-09-30 DIAGNOSIS — E785 Hyperlipidemia, unspecified: Secondary | ICD-10-CM | POA: Diagnosis present

## 2020-09-30 DIAGNOSIS — Z79899 Other long term (current) drug therapy: Secondary | ICD-10-CM | POA: Diagnosis not present

## 2020-09-30 DIAGNOSIS — M48061 Spinal stenosis, lumbar region without neurogenic claudication: Secondary | ICD-10-CM | POA: Diagnosis not present

## 2020-09-30 DIAGNOSIS — I6389 Other cerebral infarction: Secondary | ICD-10-CM

## 2020-09-30 DIAGNOSIS — R29898 Other symptoms and signs involving the musculoskeletal system: Secondary | ICD-10-CM | POA: Diagnosis not present

## 2020-09-30 DIAGNOSIS — M5416 Radiculopathy, lumbar region: Secondary | ICD-10-CM | POA: Diagnosis present

## 2020-09-30 DIAGNOSIS — I6782 Cerebral ischemia: Secondary | ICD-10-CM | POA: Diagnosis not present

## 2020-09-30 DIAGNOSIS — G8194 Hemiplegia, unspecified affecting left nondominant side: Secondary | ICD-10-CM | POA: Diagnosis present

## 2020-09-30 DIAGNOSIS — I5032 Chronic diastolic (congestive) heart failure: Secondary | ICD-10-CM | POA: Diagnosis present

## 2020-09-30 DIAGNOSIS — M5412 Radiculopathy, cervical region: Secondary | ICD-10-CM | POA: Diagnosis not present

## 2020-09-30 DIAGNOSIS — M50123 Cervical disc disorder at C6-C7 level with radiculopathy: Secondary | ICD-10-CM | POA: Diagnosis not present

## 2020-09-30 DIAGNOSIS — Z20822 Contact with and (suspected) exposure to covid-19: Secondary | ICD-10-CM | POA: Diagnosis present

## 2020-09-30 DIAGNOSIS — K219 Gastro-esophageal reflux disease without esophagitis: Secondary | ICD-10-CM | POA: Diagnosis present

## 2020-09-30 DIAGNOSIS — M6281 Muscle weakness (generalized): Secondary | ICD-10-CM | POA: Diagnosis not present

## 2020-09-30 DIAGNOSIS — M4319 Spondylolisthesis, multiple sites in spine: Secondary | ICD-10-CM | POA: Diagnosis not present

## 2020-09-30 DIAGNOSIS — I110 Hypertensive heart disease with heart failure: Secondary | ICD-10-CM | POA: Diagnosis present

## 2020-09-30 LAB — ECHOCARDIOGRAM COMPLETE
AR max vel: 2.18 cm2
AV Area VTI: 2.13 cm2
AV Area mean vel: 2.06 cm2
AV Mean grad: 8 mmHg
AV Peak grad: 15.8 mmHg
Ao pk vel: 1.99 m/s
Area-P 1/2: 3.65 cm2
S' Lateral: 2.7 cm
Single Plane A4C EF: 65.5 %

## 2020-09-30 LAB — CBC
HCT: 40 % (ref 39.0–52.0)
Hemoglobin: 13.1 g/dL (ref 13.0–17.0)
MCH: 32.3 pg (ref 26.0–34.0)
MCHC: 32.8 g/dL (ref 30.0–36.0)
MCV: 98.5 fL (ref 80.0–100.0)
Platelets: 199 10*3/uL (ref 150–400)
RBC: 4.06 MIL/uL — ABNORMAL LOW (ref 4.22–5.81)
RDW: 14.2 % (ref 11.5–15.5)
WBC: 6.4 10*3/uL (ref 4.0–10.5)
nRBC: 0 % (ref 0.0–0.2)

## 2020-09-30 LAB — COMPREHENSIVE METABOLIC PANEL
ALT: 16 U/L (ref 0–44)
AST: 20 U/L (ref 15–41)
Albumin: 3.2 g/dL — ABNORMAL LOW (ref 3.5–5.0)
Alkaline Phosphatase: 27 U/L — ABNORMAL LOW (ref 38–126)
Anion gap: 7 (ref 5–15)
BUN: 18 mg/dL (ref 8–23)
CO2: 22 mmol/L (ref 22–32)
Calcium: 8.4 mg/dL — ABNORMAL LOW (ref 8.9–10.3)
Chloride: 110 mmol/L (ref 98–111)
Creatinine, Ser: 1.23 mg/dL (ref 0.61–1.24)
GFR, Estimated: 55 mL/min — ABNORMAL LOW (ref >60)
Glucose, Bld: 99 mg/dL (ref 70–99)
Potassium: 4.1 mmol/L (ref 3.5–5.1)
Sodium: 139 mmol/L (ref 135–145)
Total Bilirubin: 1 mg/dL (ref 0.3–1.2)
Total Protein: 5.9 g/dL — ABNORMAL LOW (ref 6.5–8.1)

## 2020-09-30 LAB — LIPID PANEL
Cholesterol: 147 mg/dL (ref 0–200)
HDL: 29 mg/dL — ABNORMAL LOW (ref >40)
LDL Cholesterol: 93 mg/dL (ref 0–99)
Total CHOL/HDL Ratio: 5.1 RATIO
Triglycerides: 126 mg/dL (ref <150)
VLDL: 25 mg/dL (ref 0–40)

## 2020-09-30 LAB — HEMOGLOBIN A1C
Hgb A1c MFr Bld: 6.1 % — ABNORMAL HIGH (ref 4.8–5.6)
Mean Plasma Glucose: 128.37 mg/dL

## 2020-09-30 MED ORDER — CLOPIDOGREL BISULFATE 75 MG PO TABS
300.0000 mg | ORAL_TABLET | Freq: Once | ORAL | Status: AC
  Administered 2020-09-30: 300 mg via ORAL
  Filled 2020-09-30: qty 4

## 2020-09-30 MED ORDER — LISINOPRIL 20 MG PO TABS
20.0000 mg | ORAL_TABLET | Freq: Every day | ORAL | Status: DC
  Administered 2020-09-30 – 2020-10-02 (×3): 20 mg via ORAL
  Filled 2020-09-30 (×3): qty 1

## 2020-09-30 MED ORDER — GADOBUTROL 1 MMOL/ML IV SOLN
8.5000 mL | Freq: Once | INTRAVENOUS | Status: AC | PRN
  Administered 2020-09-30: 8.5 mL via INTRAVENOUS

## 2020-09-30 MED ORDER — CLOPIDOGREL BISULFATE 75 MG PO TABS
75.0000 mg | ORAL_TABLET | Freq: Every day | ORAL | Status: DC
  Administered 2020-10-01 – 2020-10-02 (×2): 75 mg via ORAL
  Filled 2020-09-30 (×2): qty 1

## 2020-09-30 MED ORDER — IOHEXOL 350 MG/ML SOLN
75.0000 mL | Freq: Once | INTRAVENOUS | Status: AC | PRN
  Administered 2020-09-30: 75 mL via INTRAVENOUS

## 2020-09-30 NOTE — Evaluation (Signed)
Occupational Therapy Evaluation Patient Details Name: Erik Brock MRN: [id number] DOB: 07-30-1927 Today's Date: 09/30/2020    History of Present Illness 87 y.o. male presenting as code stroke from [facility] with LLE weakness and difficulty ambulating. In ED at [facility] patient with BP 200/100. Further work-up pending. PMHx signficant for AAA 2019, CAD, OA, DDD,  pacemaker, chronic CHF, DLD, HTN, HLD, HTN and recurrent falls.   Clinical Impression   PTA patient was living at [facility] and was ambulating with use of rollator. Patient and son present at baseline report that patient requires assist for bathing/dressing at baseline but can ambulate to and perform toileting tasks without external assist. Patient currently functioning below baseline requiring Min to Mod A grossly for bed mobility, Max A for sit to stand transfers, and Max A grossly for LB ADLs. Did not progress beyond EOB this date as +2 assist was unavailable. Patient also limited by deficits listed below and would benefit from continued acute OT services in prep for safe d/c to next level of care. Recommendation for SNF rehab post acute d/c. Patient/family in agreement.     Follow Up Recommendations  SNF;Supervision/Assistance - 24 hour    Equipment Recommendations  Other (comment) (Defer to next level of care.)    Recommendations for Other Services       Precautions / Restrictions Precautions Precautions: Fall Precaution Comments: L-sided weakness LUE<LLE, HOH, DOE Restrictions Weight Bearing Restrictions: No      Mobility Bed Mobility Overal bed mobility: Needs Assistance Bed Mobility: Supine to Sit;Sit to Supine     Supine to sit: Mod assist;HOB elevated Sit to supine: Min assist   General bed mobility comments: Mod A at trunk with HOB elevated and increased time/effort. Patient able to advance BLE toward EOB without external assist. Min A for return to supine at trunk. Patient able to  advance BLE from EOB to bed surface.    Transfers Overall transfer level: Needs assistance Equipment used: Rolling walker (2 wheeled) Transfers: Sit to/from Stand;Lateral/Scoot Transfers Sit to Stand: Max assist (+2 assist unavailable at time of eval but patient would benefit.)        Lateral/Scoot Transfers: Min assist General transfer comment: Heavy Max A for sit to stand from EOB to RW with cues for hand placement, power negotiation, and foot placement. Increased time/effort required. Lateral scoots toward HOB with Min A and increased time/effort.    Balance Overall balance assessment: Needs assistance Sitting-balance support: Bilateral upper extremity supported;Feet supported Sitting balance-Leahy Scale: Poor Sitting balance - Comments: Requires Min A to maintain static sitting balance at EOB. Can correct L lateral bias with frequent min cueing. Postural control: Left lateral lean;Posterior lean   Standing balance-Leahy Scale: Poor Standing balance comment: Reliant on external assist and BUE on RW. Heavy L lateral bias requiring max multimodal cues. Unable to correct in standing.                           ADL either performed or assessed with clinical judgement   ADL Overall ADL's : Needs assistance/impaired     Grooming: Minimal assistance;Sitting           Upper Body Dressing : Moderate assistance;Sitting Upper Body Dressing Details (indicate cue type and reason): Mod A to don posterior hospital gown seated EOB. External assist to maintain static sitting balance. Lower Body Dressing: Maximal assistance Lower Body Dressing Details (indicate cue type and reason): Max A to don footwear  seated EOB.               General ADL Comments: Patient limited by decreased activity tolerance, SOB, L-sided weakness LUE<LLE, and decreased sitting/standing balance with L lateral bias.     Vision Baseline Vision/History: Wears glasses Patient Visual Report: No change  from baseline Vision Assessment?: No apparent visual deficits     Perception     Praxis      Pertinent Vitals/Pain Pain Assessment: No/denies pain     Hand Dominance Right   Extremity/Trunk Assessment Upper Extremity Assessment Upper Extremity Assessment: RUE deficits/detail;LUE deficits/detail RUE Deficits / Details: AROM WFL with exception of limited AROM at shoulder. MMT grossly 4/5 RUE Sensation: WNL LUE Deficits / Details: AROM WFL at elbow, wrist and digits. AROM deficits at shoulder bilaterally. MMT grossly 4-/5. LUE Sensation: WNL LUE Coordination: decreased fine motor;decreased gross motor   Lower Extremity Assessment Lower Extremity Assessment: Defer to PT evaluation (Reports numbness in LLE.)   Cervical / Trunk Assessment Cervical / Trunk Assessment: Kyphotic   Communication Communication Communication: HOH   Cognition Arousal/Alertness: Awake/alert Behavior During Therapy: WFL for tasks assessed/performed Overall Cognitive Status: History of cognitive impairments - at baseline                                 General Comments: Patient A&Ox4. Slow processing and mild memory deficits at baseline.   General Comments  Son present at bedside. Very supportive. Did not porgress to recliner pending futher testing for stroke work-up.    Exercises     Shoulder Instructions      Home Living Family/patient expects to be discharged to:: Skilled nursing facility   Available Help at Discharge: Family;Skilled Nursing Facility Type of Home: Assisted living                       Home Equipment: Gilford Rile - 4 wheels;Bedside commode;Shower seat - built in          Prior Functioning/Environment Level of Independence: Needs assistance  Gait / Transfers Assistance Needed: Ambulates with rollator at baseline. Walks to and from commode without external assist. ADL's / Homemaking Assistance Needed: Assist for bathing/dressing. Patient completes  toileting tasks without external assist. Able to feed himself.            OT Problem List: Decreased strength;Decreased range of motion;Decreased activity tolerance;Impaired balance (sitting and/or standing);Decreased coordination;Decreased cognition;Decreased safety awareness;Decreased knowledge of use of DME or AE;Impaired sensation      OT Treatment/Interventions: Self-care/ADL training;Therapeutic exercise;Neuromuscular education;Energy conservation;Therapeutic activities;Cognitive remediation/compensation;Patient/family education;Balance training    OT Goals(Current goals can be found in the care plan section) Acute Rehab OT Goals Patient Stated Goal: To get better. OT Goal Formulation: With patient Time For Goal Achievement: 10/14/20 Potential to Achieve Goals: Good ADL Goals Pt Will Perform Eating: with set-up;sitting Pt Will Perform Grooming: with set-up;sitting Pt Will Perform Upper Body Dressing: with set-up;sitting Pt Will Perform Lower Body Dressing: with min assist;sit to/from stand Pt Will Transfer to Toilet: with min assist;ambulating  OT Frequency: Min 2X/week   Barriers to D/C:            Co-evaluation              AM-PAC OT "6 Clicks" Daily Activity     Outcome Measure Help from another person eating meals?: A Little Help from another person taking care of personal grooming?: A Little Help from another  person toileting, which includes using toliet, bedpan, or urinal?: Total Help from another person bathing (including washing, rinsing, drying)?: A Lot Help from another person to put on and taking off regular upper body clothing?: A Lot Help from another person to put on and taking off regular lower body clothing?: A Lot 6 Click Score: 13   End of Session Equipment Utilized During Treatment: Gait belt;Rolling walker Nurse Communication: Mobility status  Activity Tolerance: Patient tolerated treatment well;Patient limited by fatigue Patient left: in  bed;with call bell/phone within reach;with bed alarm set;with family/visitor present  OT Visit Diagnosis: Unsteadiness on feet (R26.81);Other abnormalities of gait and mobility (R26.89);Muscle weakness (generalized) (M62.81);History of falling (Z91.81);Hemiplegia and hemiparesis Hemiplegia - Right/Left: Left Hemiplegia - dominant/non-dominant: Non-Dominant Hemiplegia - caused by: Unspecified (Pending stroke work-up)                Time: 9407-6808 OT Time Calculation (min): 39 min Charges:  OT General Charges $OT Visit: 1 Visit OT Evaluation $OT Eval Moderate Complexity: 1 Mod OT Treatments $Therapeutic Activity: 8-22 mins  Allexis Bordenave H. OTR/L Supplemental OT, Department of rehab services [phone]  Sakeenah Valcarcel R H. 09/30/2020, 12:07 PM

## 2020-09-30 NOTE — Consult Note (Signed)
Neurology Consultation CC: Left lower extremity weakness  Consulting provider: Dr. Sloan Leiter  History is obtained from: Chart review, patient  HPI: Erik Brock is a 87 y.o. male with a medical history significant for hypertension, coronary artery disease on home aspirin 81 mg daily, chronic diastolic heart failure, permanent pacemaker placement, hypothyroidism, hyperlipidemia, cardiomyopathy, history of lacunar strokes, and degenerative disc disease who presents to [facility] 3/16 as a transfer from [facility] for evaluation of suspected ischemic stroke requiring MRI evaluation. [name] resides in a nursing facility and notes that on waking yesterday at around 05:00, he noticed left lower extremity weakness when trying to get out of bed and ambulate to the restroom. He noted that he felt off balance and had trouble with his gait secondary to left lower extremity weakness that has been persistent since onset. On arrival to [facility], he was hypertensive with blood pressures of 200/100 and initial CT imaging was obtained without evidence of acute intracranial process. Due to his permanent pacemaker, he was transferred to [facility] to obtain an MRI of the brain and further evaluation of suspected ischemic stroke.   LKW: 09/29/20 @ 05:00 tpa given?: No, outside of time window IR Thrombectomy? No, outside of time window  NIHSS:  1a Level of Conscious.: 0 1b LOC Questions: 0 1c LOC Commands: 0 2 Best Gaze: 0 3 Visual: 0 4 Facial Palsy: 0 5a Motor Arm - left: 0 5b Motor Arm - Right: 0 6a Motor Leg - Left: 0 6b Motor Leg - Right: 0 7 Limb Ataxia: 0 8 Sensory: 0 9 Best Language: 0 10 Dysarthria: 0 11 Extinct. and Inatten.: 0 TOTAL: 0  ROS: A complete ROS was performed and is negative except as noted in the HPI.   Past Medical History:  Diagnosis Date  . Ascending aortic aneurysm ([place]) 09/05/2017  . 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 Bleeding nose   . CAD (coronary artery disease)  12/08/2014   Overview:  Mild, nonobsructive  Overview:  Mild, nonobsructive  . Cardiac pacemaker in situ 12/22/2014  . Cardiomyopathy ([place]) 09/05/2017  . Chronic diastolic heart failure ([place]) 04/05/2015  . Dyslipidemia 04/05/2015  . Elevated PSA 07/30/2013  . Essential (primary) hypertension 07/30/2013  . GERD (gastroesophageal reflux disease) 07/30/2013  . Gonalgia 11/11/2014  . Hyperlipidemia 07/30/2013  . Hypertension   . Hypertensive heart disease with heart failure ([place]) 04/05/2015  . LBBB (left bundle branch block) 12/08/2014  . Lumbar stenosis 07/30/2013  . Neuritis or radiculitis due to rupture of lumbar intervertebral disc 11/11/2014  . Pacemaker reprogramming/check 12/22/2014  . Thyroid disease   . Trigger point of right shoulder region 10/14/2014   Past Surgical History:  Procedure Laterality Date  . APPENDECTOMY    . INSERT / REPLACE / REMOVE PACEMAKER    . tonsills     Family History  Problem Relation Age of Onset  . Brain cancer Father    Social History:  reports that he has never smoked. He has never used smokeless tobacco. He reports that he does not drink alcohol and does not use drugs.   Current Scheduled Medications: . aspirin EC  81 mg Oral Daily  . finasteride  5 mg Oral Daily  . gabapentin  300 mg Oral QHS  . levothyroxine  50 mcg Oral QAC breakfast  . lisinopril  20 mg Oral Daily  . pantoprazole  40 mg Oral Daily  . simvastatin  20 mg Oral Daily   Current PRN Medications: acetaminophen **OR**  acetaminophen (TYLENOL) oral liquid 160 mg/5 mL **OR** acetaminophen, senna-docusate, tiZANidine  Exam: Current vital signs: BP (!) 149/81 (BP Location: Left Arm)   Pulse 77   Temp 98.3 F (36.8 C) (Oral)   Resp 20   SpO2 95%   Physical Exam  Constitutional: Appears well-developed and well-nourished.  Psych: Affect appropriate to situation, cooperative with examination Eyes: Normal conjunctivae. HENT: No OP obstruction, mildly hard of hearing  Head: Normocephalic  and atraumatic without obvious deformity Cardiovascular: Normal rate on cardiac monitor Respiratory: Effort normal, non-labored breathing.  GI: Soft, rounded.There is no tenderness.  Skin: WDI  Neuro: Mental Status: Patient is awake, alert, oriented to person, place, month, year, and situation. Patient is able to give a clear and coherent history. No signs of aphasia or neglect.  Cranial Nerves: II: Visual Fields are full. Pupils are equal, round, and reactive to light 2 mm/ brisk.  III,IV, VI: EOMI without ptosis or diploplia.  V: Facial sensation is symmetric to light touch.  VII: Face is symmetric resting and smiling VIII: Hearing is intact, mildly hard of hearing X: Palate elevates symmetrically. Phonation normal.  XI: Shoulder shrug is symmetric. XII: Tongue protrudes without atrophy or fasciculations.  Motor: Tone is normal. Bulk is normal.  5/5 strength was present in right upper and lower extremities. Left upper extremity slightly weaker than right upper extremity but with 5/5 strength without drift. Left lower extremity with 4/5 strength throughout but without drift on assessment.  Sensory: Sensation is symmetric to light touch and temperature in the arms and legs Cerebellar: FNF intact bilaterally  I have reviewed labs in epic and the pertinent results are: CBC    Component Value Date/Time   WBC 6.4 09/30/2020 0405   RBC 4.06 (L) 09/30/2020 0405   HGB 13.1 09/30/2020 0405   HGB 13.9 04/07/2020 1048   HCT 40.0 09/30/2020 0405   HCT 41.3 04/07/2020 1048   PLT 199 09/30/2020 0405   PLT 172 04/07/2020 1048   MCV 98.5 09/30/2020 0405   MCV 100 (H) 04/07/2020 1048   MCV 97 04/15/2013 1108   MCH 32.3 09/30/2020 0405   MCHC 32.8 09/30/2020 0405   RDW 14.2 09/30/2020 0405   RDW 13.9 04/07/2020 1048   RDW 14.0 04/15/2013 1108  CMP     Component Value Date/Time   NA 139 09/30/2020 0405   NA 140 04/07/2020 1048   K 4.1 09/30/2020 0405   CL 110 09/30/2020 0405    CO2 22 09/30/2020 0405   GLUCOSE 99 09/30/2020 0405   BUN 18 09/30/2020 0405   BUN 23 04/07/2020 1048   CREATININE 1.23 09/30/2020 0405   CALCIUM 8.4 (L) 09/30/2020 0405   PROT 5.9 (L) 09/30/2020 0405   PROT 6.9 04/07/2020 1048   ALBUMIN 3.2 (L) 09/30/2020 0405   ALBUMIN 4.3 04/07/2020 1048   AST 20 09/30/2020 0405   ALT 16 09/30/2020 0405   ALKPHOS 27 (L) 09/30/2020 0405   BILITOT 1.0 09/30/2020 0405   BILITOT 0.5 04/07/2020 1048   GFRNONAA 55 (L) 09/30/2020 0405   GFRAA 47 (L) 04/07/2020 1048   Lab Results  Component Value Date   CHOL 147 09/30/2020   HDL 29 (L) 09/30/2020   LDLCALC 93 09/30/2020   TRIG 126 09/30/2020   CHOLHDL 5.1 09/30/2020   Lab Results  Component Value Date   HGBA1C 6.1 (H) 09/30/2020   I have reviewed the images obtained: CT head 3/15: Atrophy with stable supratentorial small vessel disease. No acute infarct appreciable.  No mass or hemorrhage. There are foci of arterial vascular calcification. There is mucosal thickening in several ethmoid air cells.  MRI brain 3/16: 1. Small acute right frontoparietal white matter infarct. 2. Moderate chronic small vessel ischemic disease.  Carotid doppler ultrasound: Right Carotid: Velocities in the right ICA are consistent with a 1-39% stenosis.  Left Carotid: Velocities in the left ICA are consistent with a 1-39%  stenosis.  Vertebrals: Bilateral vertebral arteries demonstrate antegrade flow.  Subclavians: Normal flow hemodynamics were seen in bilateral subclavian arteries.   Echocardiogram: 1. Left ventricular ejection fraction, by estimation, is 60 to 65%. The left ventricle has normal function. The left ventricle has no regional wall motion abnormalities. There is moderate concentric left ventricular  hypertrophy. Left ventricular  diastolic parameters are consistent with Grade I diastolic dysfunction (impaired relaxation).  2. Right ventricular systolic function is normal. The right ventricular  size is  normal. Tricuspid regurgitation signal is inadequate for assessing PA pressure.  3. Left atrial size was mildly dilated.  4. The mitral valve is degenerative. No evidence of mitral valve  regurgitation. No evidence of mitral stenosis.  5. The aortic valve is tricuspid. There is moderate calcification of the aortic valve. There is moderate thickening of the aortic valve. Aortic valve regurgitation is mild. Mild to moderate aortic valve sclerosis/calcification is present, without any evidence of aortic stenosis.   Impression: 87 year old male with history as above who presents for further stroke evaluation after waking up on 3/15 with left lower extremity weakness and ataxia. Initial CT head imaging without acute intracranial process. He was then transferred to [facility] for MRI brain evaluation for further stroke work up due to permanent pacemaker in place.   Recommendations: - LDL of 93, goal LDL < 70. Currently on simvastatin 20 mg daily, recommend transition to Atorvastatin 40 mg daily.  - CT angio head for cerebral vessel imaging - Frequent neuro checks - Prophylactic therapy- Antiplatelet med: continue home aspirin 81 mg, add clopidogrel 300 mg once followed 75 mg daily for 3 weeks before resuming aspirin 81 mg monotherapy - Risk factor modification - Telemetry monitoring - PT consult, OT consult, Speech consult - Stroke team to follow  Pt seen by NP/Neuro and later by MD. Note/plan to be edited by MD as needed.  Anibal Henderson, AGAC-NP [facility] Pager: [phone]  I have seen the patient and reviewed the above note. The assessment and plan were jointly formulated.  I suspect that he has had a stroke related to small vessel disease. He will need secondary risk factor modification.   Roland Rack, MD [facility] [phone]  If 7pm- 7am, please page neurology on call as listed in [place].

## 2020-09-30 NOTE — Progress Notes (Signed)
OT Cancellation Note  Patient Details Name: Erik Brock MRN: [id number] DOB: 01/24/1928   Cancelled Treatment:    Reason Eval/Treat Not Completed: Active bedrest order. OT to check back as time allows.   Gloris Manchester OTR/L Supplemental OT, Department of rehab services [phone]  Kurt Azimi R H. 09/30/2020, 9:10 AM

## 2020-09-30 NOTE — Progress Notes (Signed)
Appropriate Use Committee Chart Review  Chart reviewed by the physician advisor with input from [facility] and the attending MD as needed for review of the appropriateness for SNF referral.  TOC notes, PT/OT/ST notes, nursing notes and physician notes reviewed for medical necessity to determine if the patient's needs are appropriate for short-term rehab to return to a prior level of function versus the likely need for custodial care.  At this time, the patient appears to meet Medicare criteria for SNF placement. Was from an ALF, able to toilet independently and required some assistance for dressing/bathing. Ambulated 50 ft.  Now has had functional decline.   Recommendations: The patient is SNF appropriate for short-term rehab/skilled nursing interventions. Would recommend contingency planning however, as the patient is at high risk for decline in functional status given co-morbidities.  Long-term care options should begin to be discussed with the patient/family should he not improve to his former level of function.  A consult to the Transitions of Care Team has been made to facilitate placement.    Jacquelynn Cree, MD Chief Physician Advisor  09/30/2020 12:58 PM

## 2020-09-30 NOTE — Progress Notes (Signed)
Changed device settings for MRI to  DOO at 95 bpm  Will Program device back to pre-MRI settings after completion of exam, and send transmission.

## 2020-09-30 NOTE — Progress Notes (Signed)
PT Cancellation Note  Patient Details Name: Erik Brock MRN: [id number] DOB: October 06, 1927   Cancelled Treatment:    Reason Eval/Treat Not Completed: Patient at procedure or test/unavailable On 2nd and 3rd attempt, patient off unit with multiple tests. PT will re-attempt as time allows.   [name] A. Gilford Rile PT, DPT Acute Rehabilitation Services Pager [phone] Office [phone]    [name] 09/30/2020, 3:41 PM

## 2020-09-30 NOTE — Evaluation (Signed)
Speech Language Pathology Evaluation Patient Details Name: Erik Brock MRN: [id number] DOB: Apr 18, 1928 Today's Date: 09/30/2020 Time: 9702-6378 SLP Time Calculation (min) (ACUTE ONLY): 14.92 min  Problem List:  Patient Active Problem List   Diagnosis Date Noted  . Hypertensive urgency 09/29/2020  . Radiculopathy 09/29/2020  . Left leg weakness 09/29/2020  . Hypothyroidism 09/29/2020  . Thyroid disease   . Hypertension   . Bleeding nose   . Thoracic spondylosis 06/10/2019  . Ascending aortic aneurysm ([place]) 09/05/2017  . Cardiomyopathy ([place]) 09/05/2017  . Chronic diastolic heart failure ([place]) 04/05/2015  . Dyslipidemia 04/05/2015  . Hypertensive heart disease with heart failure ([place]) 04/05/2015  . Cardiac pacemaker in situ 12/22/2014  . Pacemaker reprogramming/check 12/22/2014  . 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 CAD (coronary artery disease) 12/08/2014  . LBBB (left bundle branch block) 12/08/2014  . Gonalgia 11/11/2014  . Neuritis or radiculitis due to rupture of lumbar intervertebral disc 11/11/2014  . Trigger point of right shoulder region 10/14/2014  . Elevated PSA 07/30/2013  . GERD (gastroesophageal reflux disease) 07/30/2013  . Hyperlipidemia 07/30/2013  . Essential (primary) hypertension 07/30/2013  . Lumbar stenosis 07/30/2013   Past Medical History:  Past Medical History:  Diagnosis Date  . Ascending aortic aneurysm ([place]) 09/05/2017  . 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 Bleeding nose   . CAD (coronary artery disease) 12/08/2014   Overview:  Mild, nonobsructive  Overview:  Mild, nonobsructive  . Cardiac pacemaker in situ 12/22/2014  . Cardiomyopathy ([place]) 09/05/2017  . Chronic diastolic heart failure ([place]) 04/05/2015  . Dyslipidemia 04/05/2015  . Elevated PSA 07/30/2013  . Essential (primary) hypertension 07/30/2013  . GERD (gastroesophageal reflux disease) 07/30/2013  . Gonalgia 11/11/2014  . Hyperlipidemia 07/30/2013  . Hypertension   . Hypertensive heart disease with  heart failure ([place]) 04/05/2015  . LBBB (left bundle branch block) 12/08/2014  . Lumbar stenosis 07/30/2013  . Neuritis or radiculitis due to rupture of lumbar intervertebral disc 11/11/2014  . Pacemaker reprogramming/check 12/22/2014  . Thyroid disease   . Trigger point of right shoulder region 10/14/2014   Past Surgical History:  Past Surgical History:  Procedure Laterality Date  . APPENDECTOMY    . INSERT / REPLACE / REMOVE PACEMAKER    . tonsills     HPI:  Pt is a 87 y.o. male with medical history significant for HTN, CAD, chronic diastolic CHF, hypothyroidism, s/p pacemaker, HLD, OA, DDD who was transferred from [facility] for evaluation of suspected CVA due to left leg weakness and difficulty walking. CT head at [facility] was negative.   Assessment / Plan / Recommendation Clinical Impression  Pt participated in speech/language evaluation with his son present. Both parties denied the pt having any acute changes, but the pt reported that his memory "ain't too good". These reports were also provided by the pt's son who stated that the pt often "gets mixed up with stuff". Pt stated that he currently lives at a "resthome" and pt's son indicated that his medications and finances are managed for the pt. Reduced articulatory precision was noted and repetition was inconsistently needed during conversation, but pt's son reported that this is the pt's baseline and this could be attributed to regional dialectal variations in speech. His language skills were [facility] with need for additional processing time to complete 3-step commands. His cognitive-linguistic skills were within functional limits with some difficulty with memory, sustained attention, and higher-level executive functioning. However, pt's son reported that this is his baseline, and that he  will continue to have necessary support for more complex tasks such as medication management subsequent to discharge. Further skilled SLP services are not  clinically indicated at this time. Pt and his son were educated regarding results and recommendations; both parties verbalized understanding as well as agreement with plan of care.    SLP Assessment  SLP Recommendation/Assessment: Patient does not need any further Speech Lanaguage Pathology Services SLP Visit Diagnosis: Cognitive communication deficit (R41.841)    Follow Up Recommendations  None    Frequency and Duration           SLP Evaluation Cognition  Overall Cognitive Status: History of cognitive impairments - at baseline Arousal/Alertness: Awake/alert Orientation Level: Oriented X4 Attention: Focused;Sustained Focused Attention: Appears intact Sustained Attention: Impaired Sustained Attention Impairment: Verbal complex Memory: Impaired Memory Impairment: Storage deficit;Retrieval deficit;Decreased recall of new information (Immediate: 3/3 with repetition; delayed: 0/3; with cues: 3/3) Awareness: Appears intact Problem Solving: Appears intact (With additional processing time.) Executive Function: Reasoning Reasoning: Impaired Reasoning Impairment: Verbal complex       Comprehension  Auditory Comprehension Overall Auditory Comprehension: Appears within functional limits for tasks assessed Yes/No Questions: Within Functional Limits Basic Immediate Environment Questions:  (5/5) Complex Questions: 25-49% accurate Commands: Impaired Two Step Basic Commands:  (4/4) Multistep Basic Commands:  (3/3 with repetition) Interfering Components: Attention;Processing speed;Working Marine scientist    Expression Expression Primary Mode of Expression: Verbal Verbal Expression Overall Verbal Expression: Appears within functional limits for tasks assessed Initiation: No impairment Automatic Speech: Counting;Day of week;Month of year (WNL) Level of Generative/Spontaneous Verbalization: Conversation Repetition: No impairment Naming: No impairment Pragmatics: No impairment   Oral / Motor   Oral Motor/Sensory Function Overall Oral Motor/Sensory Function: Within functional limits Motor Speech Overall Motor Speech: Appears within functional limits for tasks assessed Respiration: Within functional limits Phonation: Normal Resonance: Within functional limits Articulation: Impaired Level of Impairment: Conversation Intelligibility: Intelligibility reduced Word: 75-100% accurate Phrase: 75-100% accurate Sentence: 75-100% accurate Conversation: 75-100% accurate Motor Planning: Witnin functional limits Motor Speech Errors: Not applicable   Shanika I. Hardin Negus, [place], [place] Office number [phone] Pager [place] 09/30/2020, 11:15 AM

## 2020-09-30 NOTE — Progress Notes (Signed)
  Echocardiogram 2D Echocardiogram has been performed.  Merrie Roof F 09/30/2020, 9:40 AM

## 2020-09-30 NOTE — Progress Notes (Signed)
Carotid US completed    Please see CV Proc for preliminary results.   Mali Eppard, RVT  

## 2020-09-30 NOTE — Progress Notes (Signed)
PROGRESS NOTE    Erik Brock  ZES:[id number] DOB: Sep 28, 1927 DOA: 09/29/2020 PCP: Mateo Flow, MD    Brief Narrative:  87 year old gentleman with history of hypertension, coronary artery disease, chronic diastolic heart failure, hypothyroidism, sick sinus syndrome status post pacemaker who presented to [facility] for evaluation of suspected stroke.  He woke up in the morning to go to bathroom and was found off balance and weakness on the left side.  Currently at assisted living facility.  On arrival to the emergency room, seen by telemetry neurology recommended MRI.  Patient with MRI compatible pacemaker but needed MRI to be done at equipped hospital so transferred to [facility].  Admitted with acute a stroke.   Assessment & Plan:   Principal Problem:   Hypertensive urgency Active Problems:   CAD (coronary artery disease)   Chronic diastolic heart failure (HCC)   Cardiac pacemaker in situ   Radiculopathy   Left leg weakness   Hypothyroidism  Acute ischemic stroke, suspected right MCA territory stroke: Clinical findings, left-sided weakness and ataxia. CT head findings, at [facility].  No acute hemorrhage or infarction. MRI of the brain, cervical spine and lumbar spine pending.  Scheduled for today. Carotid Doppler, bilateral 1 to 39% stenosis.  No significant lesion. 2D echocardiogram, diastolic dysfunction.  Normal ejection fraction.  No source of emboli. Antiplatelet therapy, on aspirin 81 mg daily. LDL pending.  Patient is on Zocor that is continued.  Hemoglobin A1c, pending. DVT prophylaxis, SCDs. Therapy recommendations, skilled nursing facility rehab. PT/OT/speech and neurology consultation.  Radiculopathy/asymmetrical weakness: Rule out spinal cause.  C-spine and L-spine MRI pending.  Continue to work with PT OT.  Coronary artery disease: Without any chest pain.  Currently on aspirin, lisinopril hydrochlorothiazide and statin.  Continue.  Chronic diastolic  heart failure: Fairly compensated.  Hypothyroidism: On Synthroid.  Sick sinus syndrome status post pacemaker: Functioning well.    DVT prophylaxis: SCD's Start: 09/29/20 2203   Code Status: Full code Family Communication: Patient's son at the bedside Disposition Plan: Status is: Observation  The patient will require care spanning > 2 midnights and should be moved to inpatient because: IV treatments appropriate due to intensity of illness or inability to take PO and Inpatient level of care appropriate due to severity of illness  Dispo: The patient is from: Assisted living facility              Anticipated d/c is to: SNF              Patient currently is not medically stable to d/c.   Difficult to place patient No   Consultants:   Neurology  Procedures:   None  Antimicrobials:   None   Subjective: Patient seen and examined.  He denied any complaints.  Patient's son was at the bedside.  Patient was starting to work with physical therapist, he was noted to have some swinging towards the left side otherwise patient himself denies any complaints.  Objective: Vitals:   09/30/20 0015 09/30/20 0215 09/30/20 0415 09/30/20 1120  BP: 138/66 (!) 146/70 (!) 159/55 (!) 149/81  Pulse: 69 70 72 77  Resp: (!) 24 (!) 21 (!) 25 20  Temp:    98.3 F (36.8 C)  TempSrc:    Oral  SpO2: 94% 95% 95% 95%    Intake/Output Summary (Last 24 hours) at 09/30/2020 1348 Last data filed at 09/30/2020 0600 Gross per 24 hour  Intake 499.04 ml  Output 700 ml  Net -200.96 ml  There were no vitals filed for this visit.  Examination:  General exam: Appears calm and comfortable  Respiratory system: Clear to auscultation. Respiratory effort normal. Cardiovascular system: S1 & S2 heard, RRR.  Pacemaker present.  Nontender.  No edema. Gastrointestinal system: Abdomen is nondistended, soft and nontender. No organomegaly or masses felt. Normal bowel sounds heard. Central nervous system: Alert and  oriented.  Cranial nerves II to XII grossly intact.  Motor strength left weaker than right.  Left upper and lower extremity 4/5.   Data Reviewed: I have personally reviewed following labs and imaging studies  CBC: Recent Labs  Lab 09/30/20 0405  WBC 6.4  HGB 13.1  HCT 40.0  MCV 98.5  PLT 086   Basic Metabolic Panel: Recent Labs  Lab 09/30/20 0405  NA 139  K 4.1  CL 110  CO2 22  GLUCOSE 99  BUN 18  CREATININE 1.23  CALCIUM 8.4*   GFR: CrCl cannot be calculated (Unknown ideal weight.). Liver Function Tests: Recent Labs  Lab 09/30/20 0405  AST 20  ALT 16  ALKPHOS 27*  BILITOT 1.0  PROT 5.9*  ALBUMIN 3.2*   No results for input(s): LIPASE, AMYLASE in the last 168 hours. No results for input(s): AMMONIA in the last 168 hours. Coagulation Profile: No results for input(s): INR, PROTIME in the last 168 hours. Cardiac Enzymes: No results for input(s): CKTOTAL, CKMB, CKMBINDEX, TROPONINI in the last 168 hours. BNP (last 3 results) No results for input(s): PROBNP in the last 8760 hours. HbA1C: No results for input(s): HGBA1C in the last 72 hours. CBG: No results for input(s): GLUCAP in the last 168 hours. Lipid Profile: Recent Labs    09/30/20 0405  CHOL 147  HDL 29*  LDLCALC 93  TRIG 126  CHOLHDL 5.1   Thyroid Function Tests: No results for input(s): TSH, T4TOTAL, FREET4, T3FREE, THYROIDAB in the last 72 hours. Anemia Panel: No results for input(s): VITAMINB12, FOLATE, FERRITIN, TIBC, IRON, RETICCTPCT in the last 72 hours. Sepsis Labs: No results for input(s): PROCALCITON, LATICACIDVEN in the last 168 hours.  No results found for this or any previous visit (from the past 240 hour(s)).       Radiology Studies: ECHOCARDIOGRAM COMPLETE  Result Date: 09/30/2020    ECHOCARDIOGRAM REPORT   Patient Name:   Erik Brock  Date of Exam: 09/30/2020 Medical Rec #:  [id number]  Height:       67.0 in Accession #:    [id number] Weight:       190.8 lb Date of Birth:   1928-04-18  BSA:          1.982 m Patient Age:    21 years   BP:           159/55 mmHg Patient Gender: M          HR:           76 bpm. Exam Location:  Inpatient Procedure: 2D Echo, Cardiac Doppler and Color Doppler Indications:    Stroke  History:        Patient has no prior history of Echocardiogram examinations.  Sonographer:    Merrie Roof RDCS Referring Phys: [id number] [place]  1. Left ventricular ejection fraction, by estimation, is 60 to 65%. The left ventricle has normal function. The left ventricle has no regional wall motion abnormalities. There is moderate concentric left ventricular hypertrophy. Left ventricular diastolic parameters are consistent with Grade I diastolic dysfunction (impaired relaxation).  2. Right ventricular systolic function  is normal. The right ventricular size is normal. Tricuspid regurgitation signal is inadequate for assessing PA pressure.  3. Left atrial size was mildly dilated.  4. The mitral valve is degenerative. No evidence of mitral valve regurgitation. No evidence of mitral stenosis.  5. The aortic valve is tricuspid. There is moderate calcification of the aortic valve. There is moderate thickening of the aortic valve. Aortic valve regurgitation is mild. Mild to moderate aortic valve sclerosis/calcification is present, without any evidence of aortic stenosis. Conclusion(s)/Recommendation(s): No intracardiac source of embolism detected on this transthoracic study. A transesophageal echocardiogram is recommended to exclude cardiac source of embolism if clinically indicated. FINDINGS  Left Ventricle: Left ventricular ejection fraction, by estimation, is 60 to 65%. The left ventricle has normal function. The left ventricle has no regional wall motion abnormalities. The left ventricular internal cavity size was normal in size. There is  moderate concentric left ventricular hypertrophy. Left ventricular diastolic parameters are consistent with Grade I  diastolic dysfunction (impaired relaxation). Right Ventricle: The right ventricular size is normal. No increase in right ventricular wall thickness. Right ventricular systolic function is normal. Tricuspid regurgitation signal is inadequate for assessing PA pressure. Left Atrium: Left atrial size was mildly dilated. Right Atrium: Right atrial size was normal in size. Pericardium: Trivial pericardial effusion is present. Presence of pericardial fat pad. Mitral Valve: The mitral valve is degenerative in appearance. There is mild calcification of the anterior and posterior mitral valve leaflet(s). Mild mitral annular calcification. No evidence of mitral valve regurgitation. No evidence of mitral valve stenosis. Tricuspid Valve: The tricuspid valve is grossly normal. Tricuspid valve regurgitation is not demonstrated. No evidence of tricuspid stenosis. Aortic Valve: The aortic valve is tricuspid. There is moderate calcification of the aortic valve. There is moderate thickening of the aortic valve. There is mild aortic valve annular calcification. Aortic valve regurgitation is mild. Mild to moderate aortic valve sclerosis/calcification is present, without any evidence of aortic stenosis. Aortic valve mean gradient measures 8.0 mmHg. Aortic valve peak gradient measures 15.8 mmHg. Aortic valve area, by VTI measures 2.13 cm. Pulmonic Valve: The pulmonic valve was grossly normal. Pulmonic valve regurgitation is not visualized. No evidence of pulmonic stenosis. Aorta: The aortic root and ascending aorta are structurally normal, with no evidence of dilitation. Venous: The inferior vena cava was not well visualized. IAS/Shunts: The atrial septum is grossly normal.  LEFT VENTRICLE PLAX 2D LVIDd:         3.50 cm     Diastology LVIDs:         2.70 cm     LV e' medial:    5.00 cm/s LV PW:         1.30 cm     LV E/e' medial:  15.9 LV IVS:        1.30 cm     LV e' lateral:   6.20 cm/s LVOT diam:     2.10 cm     LV E/e' lateral: 12.8  LV SV:         66 LV SV Index:   33 LVOT Area:     3.46 cm  LV Volumes (MOD) LV vol d, MOD A4C: 89.0 ml LV vol s, MOD A4C: 30.7 ml LV SV MOD A4C:     89.0 ml RIGHT VENTRICLE RV Basal diam:  3.70 cm LEFT ATRIUM             Index       RIGHT ATRIUM  Index LA diam:        4.20 cm 2.12 cm/m  RA Area:     19.40 cm LA Vol (A2C):   81.6 ml 41.17 ml/m RA Volume:   58.00 ml  29.26 ml/m LA Vol (A4C):   73.3 ml 36.98 ml/m LA Biplane Vol: 84.0 ml 42.38 ml/m  AORTIC VALVE AV Area (Vmax):    2.18 cm AV Area (Vmean):   2.06 cm AV Area (VTI):     2.13 cm AV Vmax:           199.00 cm/s AV Vmean:          133.000 cm/s AV VTI:            0.311 m AV Peak Grad:      15.8 mmHg AV Mean Grad:      8.0 mmHg LVOT Vmax:         125.00 cm/s LVOT Vmean:        79.100 cm/s LVOT VTI:          0.191 m LVOT/AV VTI ratio: 0.61  AORTA Ao Root diam: 3.50 cm MITRAL VALVE MV Area (PHT): 3.65 cm     SHUNTS MV Decel Time: 208 msec     Systemic VTI:  0.19 m MV E velocity: 79.60 cm/s   Systemic Diam: 2.10 cm MV A velocity: 125.00 cm/s MV E/A ratio:  0.64 Eleonore Chiquito MD Electronically signed by Eleonore Chiquito MD Signature Date/Time: 09/30/2020/9:53:30 AM    Final    VAS US CAROTID (at [facility] and [facility] only)  Result Date: 09/30/2020 Carotid Arterial Duplex Study Indications:       CVA. Risk Factors:      Hypertension, hyperlipidemia, no history of smoking, coronary                    artery disease. Comparison Study:  No previous Performing Technologist: Vonzell Schlatter RVT  Examination Guidelines: A complete evaluation includes B-mode imaging, spectral Doppler, color Doppler, and power Doppler as needed of all accessible portions of each vessel. Bilateral testing is considered an integral part of a complete examination. Limited examinations for reoccurring indications may be performed as noted.  Right Carotid Findings: +----------+--------+--------+--------+------------------+--------+           PSV cm/sEDV cm/sStenosisPlaque  DescriptionComments +----------+--------+--------+--------+------------------+--------+ CCA Prox  136     15                                         +----------+--------+--------+--------+------------------+--------+ CCA Distal96      21                                         +----------+--------+--------+--------+------------------+--------+ ICA Prox  44      11      1-39%   heterogenous               +----------+--------+--------+--------+------------------+--------+ ICA Distal49      13                                         +----------+--------+--------+--------+------------------+--------+ ECA       118     20                                         +----------+--------+--------+--------+------------------+--------+ +----------+--------+-------+--------+-------------------+  PSV cm/sEDV cmsDescribeArm Pressure (mmHG) +----------+--------+-------+--------+-------------------+ [id number]                                        +----------+--------+-------+--------+-------------------+ +---------+--------+--+--------+--+ VertebralPSV cm/s65EDV cm/s18 +---------+--------+--+--------+--+  Left Carotid Findings: +----------+--------+--------+--------+------------------+--------+           PSV cm/sEDV cm/sStenosisPlaque DescriptionComments +----------+--------+--------+--------+------------------+--------+ CCA Prox  150     21                                         +----------+--------+--------+--------+------------------+--------+ CCA Distal140     23                                         +----------+--------+--------+--------+------------------+--------+ ICA Prox  101     21      1-39%   heterogenous               +----------+--------+--------+--------+------------------+--------+ ECA       141     23                                         +----------+--------+--------+--------+------------------+--------+  +----------+--------+--------+--------+-------------------+           PSV cm/sEDV cm/sDescribeArm Pressure (mmHG) +----------+--------+--------+--------+-------------------+ [id number]                                         +----------+--------+--------+--------+-------------------+ +---------+--------+--+--------+--+ VertebralPSV cm/s63EDV cm/s20 +---------+--------+--+--------+--+   Summary: Right Carotid: Velocities in the right ICA are consistent with a 1-39% stenosis. Left Carotid: Velocities in the left ICA are consistent with a 1-39% stenosis. Vertebrals:  Bilateral vertebral arteries demonstrate antegrade flow. Subclavians: Normal flow hemodynamics were seen in bilateral subclavian              arteries. *See table(s) above for measurements and observations.  Electronically signed by Antony Contras MD on 09/30/2020 at 12:46:02 PM.    Final         Scheduled Meds: . aspirin EC  81 mg Oral Daily  . finasteride  5 mg Oral Daily  . gabapentin  300 mg Oral QHS  . levothyroxine  50 mcg Oral QAC breakfast  . lisinopril-hydrochlorothiazide  1 tablet Oral BID  . pantoprazole  40 mg Oral Daily  . simvastatin  20 mg Oral Daily   Continuous Infusions: . sodium chloride 50 mL/hr at 09/29/20 2249     LOS: 1 day    Time spent: 30 minutes    Barb Merino, MD [facility] Pager [phone]

## 2020-09-30 NOTE — Progress Notes (Signed)
PT Cancellation Note  Patient Details Name: Erik Brock MRN: [id number] DOB: 03/10/1928   Cancelled Treatment:    Reason Eval/Treat Not Completed: Patient at procedure or test/unavailable PT will re-attempt as time allows.   Marka Treloar A. Gilford Rile PT, DPT Acute Rehabilitation Services Pager [phone] Office [phone]  Erik Brock 09/30/2020, 9:34 AM

## 2020-09-30 NOTE — Progress Notes (Signed)
Attempted to see patient but patient was off unit. Will follow up tomorrow.

## 2020-10-01 LAB — SARS CORONAVIRUS 2 (TAT 6-24 HRS): SARS Coronavirus 2: NEGATIVE

## 2020-10-01 NOTE — Progress Notes (Signed)
Pt's son, Gaspar Bidding, is asking for a 2 hour notice before pt d/c tomorrow as he will meet pt. at [facility] in [place]. Will notify oncoming nurse.

## 2020-10-01 NOTE — Evaluation (Addendum)
Physical Therapy Evaluation Patient Details Name: Erik Brock MRN: [id number] DOB: 06/29/1928 Today's Date: 10/01/2020   History of Present Illness  87 y.o. male presenting as code stroke from [facility] with LLE weakness and difficulty ambulating. In ED at [facility] patient with BP 200/100. MRI showing Small acute right frontoparietal white matter infarct and Moderate chronic small vessel ischemic disease. PMHx signficant for AAA 2019, CAD, OA, DDD,  pacemaker, chronic CHF, DLD, HTN, HLD, HTN and recurrent falls.  Clinical Impression  Pt fully agreeable to therapy. Pt currently resides in ALF and states with use of rollator was I with functional mobility, but states he has had falls in the past. Pt is currently requiring max A to maintain standing due to weakness as well as an increased posterior lean. Pt with improved participation in functional mobility tasks with a squat pivot transfer. Pt will benefit from skilled PT to address deficits in balance, strength, coordination, gait, endurance and safety to maximize independence with functional mobility prior to discharge. Recommend additional rehab due to increased assist needed with OOB actifities    Follow Up Recommendations SNF (pt came from ALF)    Equipment Recommendations       Recommendations for Other Services       Precautions / Restrictions Precautions Precautions: Fall Precaution Comments: L-sided weakness LUE<LLE, HOH, DOE Restrictions Weight Bearing Restrictions: No      Mobility  Bed Mobility Overal bed mobility: Needs Assistance Bed Mobility: Supine to Sit     Supine to sit: Mod assist;HOB elevated     General bed mobility comments: Mod A at trunk with HOB elevated and increased time/effort. Patient able to advance BLE toward EOB without external assist. Upon sitting increased posterior lean    Transfers Overall transfer level: Needs assistance Equipment used: Rolling walker (2 wheeled) Transfers: Sit  to/from [employer] Sit to Stand: Max assist   Squat pivot transfers: Mod assist     General transfer comment: Heavy Max A for sit to stand from EOB to RW with cues for hand placement, power negotiation, and foot placement as well as balance due to increased posterior lean. Increased time/effort required. multiple squat pivot to recliner with therapist providing mod A for lift and guiding hips, cueing needed to reach forward due to increased posterior lean  Ambulation/Gait                Stairs            Wheelchair Mobility    Modified Rankin (Stroke Patients Only) Modified Rankin (Stroke Patients Only) Pre-Morbid Rankin Score: Moderate disability Modified Rankin: Severe disability     Balance Overall balance assessment: Needs assistance Sitting-balance support: Bilateral upper extremity supported;Feet supported Sitting balance-Leahy Scale: Poor Sitting balance - Comments: Requires Min A to maintain static sitting balance at EOB. Can correct posterior lean wtih cueing Postural control: Posterior lean Standing balance support: Bilateral upper extremity supported Standing balance-Leahy Scale: Zero Standing balance comment: Reliant on external assist and BUE on RW. increased posterior lean                             Pertinent Vitals/Pain      Home Living Family/patient expects to be discharged to:: Skilled nursing facility (has been there since Dec, pt states son says he is oging to stay there)   Available Help at Discharge: Family;Skilled Nursing Facility Type of Home: Assisted living  Home Equipment: [place] - 4 wheels;Bedside commode;Shower seat - built in      Prior Function Level of Independence: Needs assistance   Gait / Transfers Assistance Needed: Ambulates with rollator at baseline. Walks to and from commode without external assist.  ADL's / Homemaking Assistance Needed: Assist for bathing/dressing. Patient  completes toileting tasks without external assist. Able to feed himself.        Hand Dominance   Dominant Hand: Right    Extremity/Trunk Assessment   Upper Extremity Assessment Upper Extremity Assessment: Defer to OT evaluation    Lower Extremity Assessment Lower Extremity Assessment: Generalized weakness    Cervical / Trunk Assessment Cervical / Trunk Assessment: Kyphotic  Communication   Communication: HOH  Cognition                                              General Comments General comments (skin integrity, edema, etc.): RN took pt off O2 during session    Exercises     Assessment/Plan    PT Assessment Patient needs continued PT services  PT Problem List Decreased strength;Decreased mobility;Decreased safety awareness;Decreased coordination;Decreased activity tolerance;Decreased balance;Decreased knowledge of use of DME       PT Treatment Interventions Gait training;Balance training;Therapeutic exercise;Neuromuscular re-education;Functional mobility training;Therapeutic activities;Patient/family education    PT Goals (Current goals can be found in the Care Plan section)  Acute Rehab PT Goals Patient Stated Goal: To get better. PT Goal Formulation: With patient Time For Goal Achievement: 10/15/20 Potential to Achieve Goals: Fair    Frequency Min 3X/week   Barriers to discharge        Co-evaluation               AM-PAC PT "6 Clicks" Mobility  Outcome Measure Help needed turning from your back to your side while in a flat bed without using bedrails?: None Help needed moving from lying on your back to sitting on the side of a flat bed without using bedrails?: A Lot Help needed moving to and from a bed to a chair (including a wheelchair)?: A Lot Help needed standing up from a chair using your arms (e.g., wheelchair or bedside chair)?: A Lot Help needed to walk in hospital room?: Total Help needed climbing 3-5 steps with a  railing? : Total 6 Click Score: 12    End of Session Equipment Utilized During Treatment: Gait belt Activity Tolerance: Patient tolerated treatment well Patient left: in chair;with chair alarm set;with call bell/phone within reach Nurse Communication: Mobility status PT Visit Diagnosis: Unsteadiness on feet (R26.81);Other abnormalities of gait and mobility (R26.89);Muscle weakness (generalized) (M62.81);History of falling (Z91.81);Hemiplegia and hemiparesis Hemiplegia - Right/Left: Left Hemiplegia - dominant/non-dominant: Non-dominant Hemiplegia - caused by: Cerebral infarction    Time: 3220-2542 PT Time Calculation (min) (ACUTE ONLY): 29 min   Charges:   PT Evaluation $PT Eval Moderate Complexity: 1 Mod PT Treatments $Therapeutic Activity: 8-22 mins        Lyanne Co, DPT Acute Rehabilitation Services [phone]  [place] 10/01/2020, 12:44 PM

## 2020-10-01 NOTE — NC FL2 (Signed)
[place] LEVEL OF CARE SCREENING TOOL     IDENTIFICATION  Patient Name: Erik Brock Birthdate: January 25, 1928 Sex: male Admission Date (Current Location): 09/29/2020  [facility] and [place] Number:  [employer] and Address:  [facility] [place], [place] [address] [place], [place] [address]      Provider Number: [id number]  Attending Physician Name and Address:  Barb Merino, MD  Relative Name and Phone Number:       Current Level of Care: Hospital Recommended Level of Care: [place] Prior Approval Number:    Date Approved/Denied:   PASRR Number: [id number]  Discharge Plan: SNF    Current Diagnoses: Patient Active Problem List   Diagnosis Date Noted  . Stroke (cerebrum) ([place]) 09/30/2020  . Hypertensive urgency 09/29/2020  . Radiculopathy 09/29/2020  . Left leg weakness 09/29/2020  . Hypothyroidism 09/29/2020  . Thyroid disease   . Hypertension   . Bleeding nose   . Thoracic spondylosis 06/10/2019  . Ascending aortic aneurysm ([place]) 09/05/2017  . Cardiomyopathy ([place]) 09/05/2017  . Chronic diastolic heart failure ([place]) 04/05/2015  . Dyslipidemia 04/05/2015  . Hypertensive heart disease with heart failure ([place]) 04/05/2015  . Cardiac pacemaker in situ 12/22/2014  . Pacemaker reprogramming/check 12/22/2014  . 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 CAD (coronary artery disease) 12/08/2014  . LBBB (left bundle branch block) 12/08/2014  . Gonalgia 11/11/2014  . Neuritis or radiculitis due to rupture of lumbar intervertebral disc 11/11/2014  . Trigger point of right shoulder region 10/14/2014  . Elevated PSA 07/30/2013  . GERD (gastroesophageal reflux disease) 07/30/2013  . Hyperlipidemia 07/30/2013  . Essential (primary) hypertension 07/30/2013  . Lumbar stenosis 07/30/2013    Orientation RESPIRATION BLADDER Height & Weight     Self,Time,Situation,Place  Normal Continent Weight:   Height:     BEHAVIORAL SYMPTOMS/MOOD  NEUROLOGICAL BOWEL NUTRITION STATUS      Continent Diet (heart healthy)  AMBULATORY STATUS COMMUNICATION OF NEEDS Skin   Extensive Assist Verbally Normal                       Personal Care Assistance Level of Assistance  Bathing,Feeding,Dressing Bathing Assistance: Maximum assistance Feeding assistance: Limited assistance Dressing Assistance: Maximum assistance     Functional Limitations Info             SPECIAL CARE FACTORS FREQUENCY  PT (By licensed PT),OT (By licensed OT)     PT Frequency: 5x/wk OT Frequency: 5x/wk            Contractures Contractures Info: Not present    Additional Factors Info  Code Status,Allergies Code Status Info: Full Allergies Info: NKA           Current Medications (10/01/2020):  This is the current hospital active medication list Current Facility-Administered Medications  Medication Dose Route Frequency Provider Last Rate Last Admin  . 0.9 %  sodium chloride infusion   Intravenous Continuous Chotiner, Yevonne Aline, MD 50 mL/hr at 09/30/20 2144 New Bag at 09/30/20 2144  . acetaminophen (TYLENOL) tablet 650 mg  650 mg Oral Q4H PRN Chotiner, Yevonne Aline, MD       Or  . acetaminophen (TYLENOL) 160 MG/5ML solution 650 mg  650 mg Per Tube Q4H PRN Chotiner, Yevonne Aline, MD       Or  . acetaminophen (TYLENOL) suppository 650 mg  650 mg Rectal Q4H PRN Chotiner, Yevonne Aline, MD      . aspirin EC tablet 81  mg  81 mg Oral Daily Chotiner, Yevonne Aline, MD   81 mg at 09/30/20 1202  . clopidogrel (PLAVIX) tablet 75 mg  75 mg Oral Daily Anibal Henderson W, NP      . finasteride (PROSCAR) tablet 5 mg  5 mg Oral Daily Chotiner, Yevonne Aline, MD   5 mg at 09/30/20 1202  . gabapentin (NEURONTIN) capsule 300 mg  300 mg Oral QHS Chotiner, Yevonne Aline, MD   300 mg at 09/30/20 2144  . levothyroxine (SYNTHROID) tablet 50 mcg  50 mcg Oral QAC breakfast Chotiner, Yevonne Aline, MD   50 mcg at 10/01/20 [phone]  . lisinopril (ZESTRIL) tablet 20 mg  20 mg Oral Daily Barb Merino,  MD   20 mg at 09/30/20 1754  . pantoprazole (PROTONIX) EC tablet 40 mg  40 mg Oral Daily Chotiner, Yevonne Aline, MD   40 mg at 09/30/20 1202  . senna-docusate (Senokot-S) tablet 1 tablet  1 tablet Oral QHS PRN Chotiner, Yevonne Aline, MD      . simvastatin (ZOCOR) tablet 20 mg  20 mg Oral Daily Chotiner, Yevonne Aline, MD   20 mg at 09/30/20 1202  . tiZANidine (ZANAFLEX) tablet 2 mg  2 mg Oral Daily PRN Chotiner, Yevonne Aline, MD         Discharge Medications: Please see discharge summary for a list of discharge medications.  Relevant Imaging Results:  Relevant Lab Results:   Additional Information SS#: [id number]  Geralynn Ochs, LCSW

## 2020-10-01 NOTE — TOC Initial Note (Signed)
Transition of Care [facility]) - Initial/Assessment Note    Patient Details  Name: Erik Brock MRN: [id number] Date of Birth: 11-10-1927  Transition of Care [facility] CM/SW Contact:    Geralynn Ochs, LCSW Phone Number: 10/01/2020, 11:27 AM  Clinical Narrative:       CSW received message from MD this morning that patient and son agreeable to SNF, preference for [facility] in [place]. CSW sent referral and confirmed bed availability for patient. CSW then met with patient at bedside to update, and also spoke with son Gaspar Bidding via phone. [facility] will have bed available for patient tomorrow, if medically stable. CSW to follow.            Expected Discharge Plan: Skilled Nursing Facility Barriers to Discharge: Continued Medical Work up   Patient Goals and CMS Choice Patient states their goals for this hospitalization and ongoing recovery are:: to get rehab CMS Medicare.gov Compare Post Acute Care list provided to:: Patient Choice offered to / list presented to : Patient  Expected Discharge Plan and Services Expected Discharge Plan: [place] Choice: [place] Living arrangements for the past 2 months: [place]                                      Prior Living Arrangements/Services Living arrangements for the past 2 months: [place] Lives with:: Facility Resident Patient language and need for interpreter reviewed:: No Do you feel safe going back to the place where you live?: Yes      Need for Family Participation in Patient Care: No (Comment) Care giver support system in place?: Yes (comment)   Criminal Activity/Legal Involvement Pertinent to Current Situation/Hospitalization: No - Comment as needed  Activities of Daily Living      Permission Sought/Granted Permission sought to share information with : Facility Retail banker granted to share information with : Yes,  Verbal Permission Granted  Share Information with NAME: Gaspar Bidding  Permission granted to share info w AGENCY: SNF  Permission granted to share info w Relationship: Son     Emotional Assessment Appearance:: Appears stated age Attitude/Demeanor/Rapport: Engaged Affect (typically observed): Appropriate Orientation: : Oriented to Self,Oriented to Place,Oriented to  Time,Oriented to Situation Alcohol / Substance Use: Not Applicable Psych Involvement: No (comment)  Admission diagnosis:  Hypertensive urgency [I16.0] Stroke (cerebrum) [facility]) [I63.9] Patient Active Problem List   Diagnosis Date Noted  . Stroke (cerebrum) ([place]) 09/30/2020  . Hypertensive urgency 09/29/2020  . Radiculopathy 09/29/2020  . Left leg weakness 09/29/2020  . Hypothyroidism 09/29/2020  . Thyroid disease   . Hypertension   . Bleeding nose   . Thoracic spondylosis 06/10/2019  . Ascending aortic aneurysm ([place]) 09/05/2017  . Cardiomyopathy ([place]) 09/05/2017  . Chronic diastolic heart failure ([place]) 04/05/2015  . Dyslipidemia 04/05/2015  . Hypertensive heart disease with heart failure ([place]) 04/05/2015  . Cardiac pacemaker in situ 12/22/2014  . Pacemaker reprogramming/check 12/22/2014  . 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 CAD (coronary artery disease) 12/08/2014  . LBBB (left bundle branch block) 12/08/2014  . Gonalgia 11/11/2014  . Neuritis or radiculitis due to rupture of lumbar intervertebral disc 11/11/2014  . Trigger point of right shoulder region 10/14/2014  . Elevated PSA 07/30/2013  . GERD (gastroesophageal reflux disease) 07/30/2013  . Hyperlipidemia 07/30/2013  . Essential (primary) hypertension 07/30/2013  . Lumbar stenosis 07/30/2013  PCP:  Mateo Flow, MD Pharmacy:   [facility] [address] [place] [address] [place] [address] Phone: [phone] Fax: [phone]     Social Determinants of Health (SDOH) Interventions    Readmission Risk  Interventions No flowsheet data found.

## 2020-10-01 NOTE — Progress Notes (Signed)
PROGRESS NOTE    Erik Brock  ACZ:[id number] DOB: December 19, 1927 DOA: 09/29/2020 PCP: Mateo Flow, MD    Brief Narrative:  87 year old gentleman with history of hypertension, coronary artery disease, chronic diastolic heart failure, hypothyroidism, sick sinus syndrome status post pacemaker who presented to [facility] for evaluation of suspected stroke.  He woke up in the morning to go to bathroom and was found off balance and weakness on the left side.  Currently at assisted living facility.  On arrival to the emergency room, seen by telemetry neurology recommended MRI.  Patient with MRI compatible pacemaker but needed MRI to be done at equipped hospital so transferred to [facility].  Admitted with acute stroke.   Assessment & Plan:   Principal Problem:   Hypertensive urgency Active Problems:   CAD (coronary artery disease)   Chronic diastolic heart failure (HCC)   Cardiac pacemaker in situ   Radiculopathy   Left leg weakness   Hypothyroidism   Stroke (cerebrum) (HCC)  Acute ischemic stroke, right MCA territory stroke: Clinical findings, left-sided weakness and ataxia. CT head findings, at [facility].  No acute hemorrhage or infarction. MRI of the brain, right frontoparietal acute ischemia. MRI of the C-spine and lumbar spine with chronic degenerative disease.  No acute findings. Carotid Doppler, bilateral 1 to 39% stenosis.  No significant lesion. CTA of the head and neck with moderate atherosclerosis.  No large vessel occlusion. 2D echocardiogram, diastolic dysfunction.  Normal ejection fraction.  No source of emboli. Antiplatelet therapy, on aspirin 81 mg daily.  Additional Plavix 300 mg once loaded, follow-up with 75 mg daily for 3 weeks and continue aspirin 81 mg daily. LDL 93.  On simvastatin 20 mg at home.  Transitioning to atorvastatin 40 mg daily.    Hemoglobin A1c 6.1.  No indication for treatment. DVT prophylaxis, SCDs. Therapy recommendations, skilled nursing  facility rehab. PT/OT/speech and neurology consultation.  Radiculopathy/asymmetrical weakness: Rule out spinal cause.  C-spine and L-spine MRI with chronic changes.  Continue to work with PT OT.  Coronary artery disease: Without any chest pain.  Currently on aspirin, lisinopril hydrochlorothiazide and statin.  Continue.  Chronic diastolic heart failure: Fairly compensated.  Hypothyroidism: On Synthroid.  Sick sinus syndrome status post pacemaker: Functioning well.    DVT prophylaxis: SCD's Start: 09/29/20 2203   Code Status: Full code Family Communication: Patient's son at the bedside 3/16. Disposition Plan: Status is: Inpatient.    Dispo: The patient is from: Assisted living facility              Anticipated d/c is to: SNF              Patient currently is not medically stable to d/c.   Difficult to place patient No   Consultants:   Neurology  Procedures:   None  Antimicrobials:   None   Subjective: Patient seen and examined.  No overnight events.  Left leg feels more weaker than the left arm.  He understands that he had a stroke on needs to go to a skilled nursing rehab.  Objective: Vitals:   10/01/20 0329 10/01/20 0743 10/01/20 1113 10/01/20 1253  BP: (!) 142/89 (!) 158/77 (!) 163/96 (!) 151/74  Pulse: 76 74 76 73  Resp: 18 20  (!) 23  Temp: 97.8 F (36.6 C) 98.3 F (36.8 C)  99.8 F (37.7 C)  TempSrc: Oral Oral  Oral  SpO2: 98% 93% 91% 95%    Intake/Output Summary (Last 24 hours) at 10/01/2020 1357 Last  data filed at 10/01/2020 1200 Gross per 24 hour  Intake 1058.82 ml  Output 1750 ml  Net -691.18 ml   There were no vitals filed for this visit.  Examination:  General exam: Appears calm and comfortable , age-appropriate.  Looks comfortable. Respiratory system: Clear to auscultation. Respiratory effort normal. Cardiovascular system: S1 & S2 heard, RRR.  Pacemaker present.  Nontender.  No edema. Gastrointestinal system: Abdomen is nondistended,  soft and nontender. No organomegaly or masses felt. Normal bowel sounds heard. Central nervous system: Alert and oriented.  Cranial nerves II to XII grossly intact.  Motor strength left weaker than right.  Left upper and lower extremity 4/5.   Data Reviewed: I have personally reviewed following labs and imaging studies  CBC: Recent Labs  Lab 09/30/20 0405  WBC 6.4  HGB 13.1  HCT 40.0  MCV 98.5  PLT 371   Basic Metabolic Panel: Recent Labs  Lab 09/30/20 0405  NA 139  K 4.1  CL 110  CO2 22  GLUCOSE 99  BUN 18  CREATININE 1.23  CALCIUM 8.4*   GFR: CrCl cannot be calculated (Unknown ideal weight.). Liver Function Tests: Recent Labs  Lab 09/30/20 0405  AST 20  ALT 16  ALKPHOS 27*  BILITOT 1.0  PROT 5.9*  ALBUMIN 3.2*   No results for input(s): LIPASE, AMYLASE in the last 168 hours. No results for input(s): AMMONIA in the last 168 hours. Coagulation Profile: No results for input(s): INR, PROTIME in the last 168 hours. Cardiac Enzymes: No results for input(s): CKTOTAL, CKMB, CKMBINDEX, TROPONINI in the last 168 hours. BNP (last 3 results) No results for input(s): PROBNP in the last 8760 hours. HbA1C: Recent Labs    09/30/20 0405  HGBA1C 6.1*   CBG: No results for input(s): GLUCAP in the last 168 hours. Lipid Profile: Recent Labs    09/30/20 0405  CHOL 147  HDL 29*  LDLCALC 93  TRIG 126  CHOLHDL 5.1   Thyroid Function Tests: No results for input(s): TSH, T4TOTAL, FREET4, T3FREE, THYROIDAB in the last 72 hours. Anemia Panel: No results for input(s): VITAMINB12, FOLATE, FERRITIN, TIBC, IRON, RETICCTPCT in the last 72 hours. Sepsis Labs: No results for input(s): PROCALCITON, LATICACIDVEN in the last 168 hours.  No results found for this or any previous visit (from the past 240 hour(s)).       Radiology Studies: CT ANGIO HEAD W OR WO CONTRAST  Result Date: 10/01/2020 CLINICAL DATA:  Follow-up examination for acute stroke. EXAM: CT ANGIOGRAPHY  HEAD TECHNIQUE: Multidetector CT imaging of the head was performed using the standard protocol during bolus administration of intravenous contrast. Multiplanar CT image reconstructions and MIPs were obtained to evaluate the vascular anatomy. CONTRAST:  75mL OMNIPAQUE IOHEXOL 350 MG/ML SOLN COMPARISON:  Prior brain MRI from earlier the same day. FINDINGS: CT HEAD Brain: Generalized age-related cerebral atrophy with chronic microvascular ischemic disease again noted. Previously identified small acute ischemic infarct involving the subcortical right frontal parietal white matter again seen, stable in size as compared to previous. No evidence for hemorrhagic transformation or significant mass effect. No other visible acute large vessel territory infarct. No acute intracranial hemorrhage. No mass lesion, midline shift or mass effect. No hydrocephalus or extra-axial fluid collection. Vascular: No hyperdense vessel. Scattered vascular calcifications noted within the carotid siphons. Skull: Scalp soft tissues and calvarium within normal limits. Sinuses: Patient status post bilateral ocular lens replacement. Mild scattered mucosal thickening noted within the ethmoidal air cells and maxillary sinuses. Paranasal sinuses are otherwise  clear. No mastoid effusion. Orbits: Globes and orbital soft tissues within normal limits. CTA HEAD Anterior circulation: Visualized distal cervical segments of the internal carotid arteries are widely patent bilaterally. Petrous segments widely patent. Scattered atheromatous plaque within the cavernous/supraclinoid segments with associated mild to moderate multifocal narrowing (no more than 50%). A1 segments patent bilaterally. Normal anterior communicating artery complex. Anterior cerebral arteries patent to their distal aspects without stenosis. Short-segment mild stenosis at the proximal right M1 segment. M1 segments otherwise widely patent. Normal MCA bifurcations. No proximal M2 branch  occlusion. Diffuse small vessel atheromatous irregularity seen throughout the distal MCA branches bilaterally. Posterior circulation: Both vertebral arteries widely patent as they course into the cranial vault. Atheromatous plaque within the proximal right V4 segment with associated mild short-segment stenosis. Vertebral arteries otherwise patent to the vertebrobasilar junction. Both PICA origins patent and normal. Basilar patent to its distal aspect without stenosis. Superior cerebral arteries patent bilaterally. Both PCAs primarily supplied via the basilar. Atheromatous irregularity throughout the right PCA without high-grade stenosis. Short-segment severe proximal left P2 stenosis (series 17, image 104). Subsequent downstream distal left P3 occlusion (series 17, image 128). Atheromatous irregularity elsewhere throughout the left PCA distribution. Venous sinuses: Grossly patent allowing for timing the contrast bolus. Anatomic variants: None significant.  No aneurysm. IMPRESSION: CT HEAD IMPRESSION: 1. Stable appearance of evolving small acute ischemic infarct involving the subcortical right frontoparietal white matter. No evidence for hemorrhagic transformation or significant mass effect. 2. No other new acute intracranial abnormality. 3. Atrophy with moderate chronic microvascular ischemic disease. CTA HEAD AND NECK IMPRESSION: 1. Negative CTA for acute large vessel occlusion. 2. Severe short-segment proximal left P2 stenosis with downstream distal left P3 occlusion. 3. Moderate atheromatous irregularity throughout the carotid siphons with associated mild to moderate multifocal narrowing. 4. Additional diffuse small vessel atheromatous irregularity. No other proximal high-grade or correctable stenosis. Electronically Signed   By: Jeannine Boga M.D.   On: 10/01/2020 02:16   DG Chest 1 View  Result Date: 09/30/2020 CLINICAL DATA:  Evaluate for abandoned leads prior to MRI. EXAM: CHEST  1 VIEW COMPARISON:   Chest x-ray from same day. FINDINGS: Unchanged left chest wall pacemaker with leads terminating in the right atrium and right ventricle. No abandoned leads identified. Unchanged cardiomegaly. Normal pulmonary vascularity. No focal consolidation, pleural effusion, or pneumothorax. No acute osseous abnormality. IMPRESSION: 1. No abandoned leads identified. 2. No active disease. Electronically Signed   By: Titus Dubin M.D.   On: 09/30/2020 14:09   MR BRAIN WO CONTRAST  Result Date: 09/30/2020 CLINICAL DATA:  Left leg weakness. EXAM: MRI HEAD WITHOUT CONTRAST TECHNIQUE: Multiplanar, multiecho pulse sequences of the brain and surrounding structures were obtained without intravenous contrast. COMPARISON:  Head CT 09/29/2020 FINDINGS: Brain: There is a 1 cm acute infarct in the right frontoparietal white matter at the posterior aspect of the centrum semiovale. No intracranial hemorrhage, mass, midline shift, or extra-axial fluid collection is identified. Patchy T2 hyperintensities in the cerebral white matter bilaterally are nonspecific but compatible with moderate chronic small vessel ischemic disease. T2 hyperintensities in the basal ganglia and surrounding white matter tracts bilaterally likely reflect a combination of dilated perivascular spaces and chronic lacunar infarcts. There is mild cerebral atrophy. Vascular: Major intracranial vascular flow voids are preserved. Skull and upper cervical spine: Unremarkable bone marrow signal. Sinuses/Orbits: Bilateral cataract extraction. Minimal mucosal thickening in the paranasal sinuses. Clear mastoid air cells. Other: None. IMPRESSION: 1. Small acute right frontoparietal white matter infarct. 2. Moderate chronic small  vessel ischemic disease. Electronically Signed   By: Logan Bores M.D.   On: 09/30/2020 17:00   MR CERVICAL SPINE W WO CONTRAST  Result Date: 09/30/2020 CLINICAL DATA:  Cervical radiculopathy.  Left leg weakness. EXAM: MRI CERVICAL SPINE WITHOUT  AND WITH CONTRAST TECHNIQUE: Multiplanar and multiecho pulse sequences of the cervical spine, to include the craniocervical junction and cervicothoracic junction, were obtained without and with intravenous contrast. CONTRAST:  8.41mL GADAVIST GADOBUTROL 1 MMOL/ML IV SOLN COMPARISON:  08/31/2017 FINDINGS: Multiple sequences are moderately motion degraded. Alignment: Unchanged grade 1 retrolisthesis of C3 on C4 and grade 1 anterolisthesis of C7 on T1. Vertebrae: No fracture, suspicious osseous lesion, or significant marrow edema. Cord: No cord signal abnormality identified within limitations of motion artifact. No abnormal intradural enhancement. Posterior Fossa, vertebral arteries, paraspinal tissues: Preserved vertebral artery flow voids. No acute finding in the paraspinal soft tissues. Posterior fossa more fully evaluated on separate brain MRI. Disc levels: C2-3: Mild uncovertebral spurring. Right facet ankylosis. No stenosis. C3-4: Retrolisthesis with bulging uncovered disc, uncovertebral spurring, and mild facet arthrosis result in moderate bilateral neural foraminal stenosis, similar to the prior MRI. No significant spinal stenosis. C4-5: Disc bulging/a broad left paracentral disc protrusion, uncovertebral spurring, infolding of the ligamentum flavum, and right greater than left facet arthrosis result in mild spinal stenosis and moderate left greater than right neural foraminal stenosis, similar to the prior MRI. C5-6: Disc bulging, uncovertebral spurring, infolding of the ligamentum flavum, and mild facet arthrosis result in borderline to mild spinal stenosis and moderate left neural foraminal stenosis, similar to the prior MRI. C6-7: Disc bulging and uncovertebral spurring result in mild right and moderate left neural foraminal stenosis, stable to mildly progressed. No significant spinal stenosis. C7-T1: Anterolisthesis with disc uncovering and severe left facet arthrosis without significant stenosis,  unchanged. IMPRESSION: Motion degraded examination with similar appearance of diffuse cervical disc degeneration compared to the 2019 MRI. Up to mild spinal stenosis and moderate neural foraminal stenosis at multiple levels as detailed above. Electronically Signed   By: Logan Bores M.D.   On: 09/30/2020 17:11   MR Lumbar Spine W Wo Contrast  Result Date: 09/30/2020 CLINICAL DATA:  Left leg weakness. EXAM: MRI LUMBAR SPINE WITHOUT AND WITH CONTRAST TECHNIQUE: Multiplanar and multiecho pulse sequences of the lumbar spine were obtained without and with intravenous contrast. CONTRAST:  8.5mL GADAVIST GADOBUTROL 1 MMOL/ML IV SOLN COMPARISON:  11/06/2019 FINDINGS: Segmentation:  Standard. Alignment: Mild lumbar levoscoliosis. Unchanged trace retrolisthesis of L1 on L2 and L2 on L3 and trace anterolisthesis of L4 on L5 and L5 on S1. Vertebrae: No fracture or suspicious osseous lesion. Mild degenerative endplate edema at B7-6, decreased from prior. Conus medullaris and cauda equina: Conus extends to the T12-L1 level. Conus and cauda equina appear normal. Paraspinal and other soft tissues: Partially visualized bilateral renal cysts. Disc levels: Disc desiccation throughout the lumbar spine. Chronic severe disc space narrowing at L2-3 and L5-S1. T12-L1: Negative. L1-2: Mild disc bulging and mild facet arthrosis without stenosis, unchanged. L2-3: Disc bulging, a new or larger right subarticular disc protrusion, endplate spurring, severe disc space height loss, and mild facet and ligamentum flavum hypertrophy result in mild spinal stenosis, moderate to severe right and moderate left lateral recess stenosis, and mild-to-moderate right and mild left neural foraminal stenosis. Potential bilateral L3 nerve root impingement, particularly on the right. L3-4: Disc bulging, a left subarticular to left foraminal disc protrusion, endplate spurring, and mild facet and ligamentum flavum hypertrophy result in mild right  and moderate  left lateral recess stenosis and mild-to-moderate bilateral neural foraminal stenosis, mildly progressed. No spinal stenosis. L4-5: Anterolisthesis with bulging uncovered disc and moderate facet hypertrophy result in mild spinal stenosis, mild bilateral lateral recess stenosis, and mild right and moderate left neural foraminal stenosis, stable to minimally progressed. Bilateral facet joint effusions which can be seen in the setting of instability. L5-S1: Anterolisthesis with mild bulging of uncovered disc and severe facet hypertrophy result in mild bilateral neural foraminal stenosis without spinal stenosis, unchanged. Right facet joint ankylosis. IMPRESSION: 1. New/larger right subarticular disc protrusion at L2-3 with progressive right lower greater than left lateral recess stenosis. 2. Mild-to-moderate lateral recess and neural foraminal stenosis at L3-4 and L4-5, mildly progressed. Electronically Signed   By: Logan Bores M.D.   On: 09/30/2020 18:55   ECHOCARDIOGRAM COMPLETE  Result Date: 09/30/2020    ECHOCARDIOGRAM REPORT   Patient Name:   Marcella Tew  Date of Exam: 09/30/2020 Medical Rec #:  [id number]  Height:       67.0 in Accession #:    [id number] Weight:       190.8 lb Date of Birth:  1927-08-17  BSA:          1.982 m Patient Age:    34 years   BP:           159/55 mmHg Patient Gender: M          HR:           76 bpm. Exam Location:  Inpatient Procedure: 2D Echo, Cardiac Doppler and Color Doppler Indications:    Stroke  History:        Patient has no prior history of Echocardiogram examinations.  Sonographer:    Merrie Roof RDCS Referring Phys: [id number] [place]  1. Left ventricular ejection fraction, by estimation, is 60 to 65%. The left ventricle has normal function. The left ventricle has no regional wall motion abnormalities. There is moderate concentric left ventricular hypertrophy. Left ventricular diastolic parameters are consistent with Grade I diastolic dysfunction (impaired  relaxation).  2. Right ventricular systolic function is normal. The right ventricular size is normal. Tricuspid regurgitation signal is inadequate for assessing PA pressure.  3. Left atrial size was mildly dilated.  4. The mitral valve is degenerative. No evidence of mitral valve regurgitation. No evidence of mitral stenosis.  5. The aortic valve is tricuspid. There is moderate calcification of the aortic valve. There is moderate thickening of the aortic valve. Aortic valve regurgitation is mild. Mild to moderate aortic valve sclerosis/calcification is present, without any evidence of aortic stenosis. Conclusion(s)/Recommendation(s): No intracardiac source of embolism detected on this transthoracic study. A transesophageal echocardiogram is recommended to exclude cardiac source of embolism if clinically indicated. FINDINGS  Left Ventricle: Left ventricular ejection fraction, by estimation, is 60 to 65%. The left ventricle has normal function. The left ventricle has no regional wall motion abnormalities. The left ventricular internal cavity size was normal in size. There is  moderate concentric left ventricular hypertrophy. Left ventricular diastolic parameters are consistent with Grade I diastolic dysfunction (impaired relaxation). Right Ventricle: The right ventricular size is normal. No increase in right ventricular wall thickness. Right ventricular systolic function is normal. Tricuspid regurgitation signal is inadequate for assessing PA pressure. Left Atrium: Left atrial size was mildly dilated. Right Atrium: Right atrial size was normal in size. Pericardium: Trivial pericardial effusion is present. Presence of pericardial fat pad. Mitral Valve: The mitral valve is degenerative in appearance. There  is mild calcification of the anterior and posterior mitral valve leaflet(s). Mild mitral annular calcification. No evidence of mitral valve regurgitation. No evidence of mitral valve stenosis. Tricuspid Valve: The  tricuspid valve is grossly normal. Tricuspid valve regurgitation is not demonstrated. No evidence of tricuspid stenosis. Aortic Valve: The aortic valve is tricuspid. There is moderate calcification of the aortic valve. There is moderate thickening of the aortic valve. There is mild aortic valve annular calcification. Aortic valve regurgitation is mild. Mild to moderate aortic valve sclerosis/calcification is present, without any evidence of aortic stenosis. Aortic valve mean gradient measures 8.0 mmHg. Aortic valve peak gradient measures 15.8 mmHg. Aortic valve area, by VTI measures 2.13 cm. Pulmonic Valve: The pulmonic valve was grossly normal. Pulmonic valve regurgitation is not visualized. No evidence of pulmonic stenosis. Aorta: The aortic root and ascending aorta are structurally normal, with no evidence of dilitation. Venous: The inferior vena cava was not well visualized. IAS/Shunts: The atrial septum is grossly normal.  LEFT VENTRICLE PLAX 2D LVIDd:         3.50 cm     Diastology LVIDs:         2.70 cm     LV e' medial:    5.00 cm/s LV PW:         1.30 cm     LV E/e' medial:  15.9 LV IVS:        1.30 cm     LV e' lateral:   6.20 cm/s LVOT diam:     2.10 cm     LV E/e' lateral: 12.8 LV SV:         66 LV SV Index:   33 LVOT Area:     3.46 cm  LV Volumes (MOD) LV vol d, MOD A4C: 89.0 ml LV vol s, MOD A4C: 30.7 ml LV SV MOD A4C:     89.0 ml RIGHT VENTRICLE RV Basal diam:  3.70 cm LEFT ATRIUM             Index       RIGHT ATRIUM           Index LA diam:        4.20 cm 2.12 cm/m  RA Area:     19.40 cm LA Vol (A2C):   81.6 ml 41.17 ml/m RA Volume:   58.00 ml  29.26 ml/m LA Vol (A4C):   73.3 ml 36.98 ml/m LA Biplane Vol: 84.0 ml 42.38 ml/m  AORTIC VALVE AV Area (Vmax):    2.18 cm AV Area (Vmean):   2.06 cm AV Area (VTI):     2.13 cm AV Vmax:           199.00 cm/s AV Vmean:          133.000 cm/s AV VTI:            0.311 m AV Peak Grad:      15.8 mmHg AV Mean Grad:      8.0 mmHg LVOT Vmax:         125.00  cm/s LVOT Vmean:        79.100 cm/s LVOT VTI:          0.191 m LVOT/AV VTI ratio: 0.61  AORTA Ao Root diam: 3.50 cm MITRAL VALVE MV Area (PHT): 3.65 cm     SHUNTS MV Decel Time: 208 msec     Systemic VTI:  0.19 m MV E velocity: 79.60 cm/s   Systemic Diam: 2.10 cm MV A velocity: 125.00  cm/s MV E/A ratio:  0.64 Eleonore Chiquito MD Electronically signed by Eleonore Chiquito MD Signature Date/Time: 09/30/2020/9:53:30 AM    Final    VAS US CAROTID (at [facility] and [facility] only)  Result Date: 09/30/2020 Carotid Arterial Duplex Study Indications:       CVA. Risk Factors:      Hypertension, hyperlipidemia, no history of smoking, coronary                    artery disease. Comparison Study:  No previous Performing Technologist: Vonzell Schlatter RVT  Examination Guidelines: A complete evaluation includes B-mode imaging, spectral Doppler, color Doppler, and power Doppler as needed of all accessible portions of each vessel. Bilateral testing is considered an integral part of a complete examination. Limited examinations for reoccurring indications may be performed as noted.  Right Carotid Findings: +----------+--------+--------+--------+------------------+--------+           PSV cm/sEDV cm/sStenosisPlaque DescriptionComments +----------+--------+--------+--------+------------------+--------+ CCA Prox  136     15                                         +----------+--------+--------+--------+------------------+--------+ CCA Distal96      21                                         +----------+--------+--------+--------+------------------+--------+ ICA Prox  44      11      1-39%   heterogenous               +----------+--------+--------+--------+------------------+--------+ ICA Distal49      13                                         +----------+--------+--------+--------+------------------+--------+ ECA       118     20                                          +----------+--------+--------+--------+------------------+--------+ +----------+--------+-------+--------+-------------------+           PSV cm/sEDV cmsDescribeArm Pressure (mmHG) +----------+--------+-------+--------+-------------------+ [id number]                                        +----------+--------+-------+--------+-------------------+ +---------+--------+--+--------+--+ VertebralPSV cm/s65EDV cm/s18 +---------+--------+--+--------+--+  Left Carotid Findings: +----------+--------+--------+--------+------------------+--------+           PSV cm/sEDV cm/sStenosisPlaque DescriptionComments +----------+--------+--------+--------+------------------+--------+ CCA Prox  150     21                                         +----------+--------+--------+--------+------------------+--------+ CCA Distal140     23                                         +----------+--------+--------+--------+------------------+--------+ ICA Prox  101     21      1-39%   heterogenous               +----------+--------+--------+--------+------------------+--------+  ECA       141     23                                         +----------+--------+--------+--------+------------------+--------+ +----------+--------+--------+--------+-------------------+           PSV cm/sEDV cm/sDescribeArm Pressure (mmHG) +----------+--------+--------+--------+-------------------+ [id number]                                         +----------+--------+--------+--------+-------------------+ +---------+--------+--+--------+--+ VertebralPSV cm/s63EDV cm/s20 +---------+--------+--+--------+--+   Summary: Right Carotid: Velocities in the right ICA are consistent with a 1-39% stenosis. Left Carotid: Velocities in the left ICA are consistent with a 1-39% stenosis. Vertebrals:  Bilateral vertebral arteries demonstrate antegrade flow. Subclavians: Normal flow hemodynamics were seen in  bilateral subclavian              arteries. *See table(s) above for measurements and observations.  Electronically signed by Antony Contras MD on 09/30/2020 at 12:46:02 PM.    Final         Scheduled Meds: . aspirin EC  81 mg Oral Daily  . clopidogrel  75 mg Oral Daily  . finasteride  5 mg Oral Daily  . gabapentin  300 mg Oral QHS  . levothyroxine  50 mcg Oral QAC breakfast  . lisinopril  20 mg Oral Daily  . pantoprazole  40 mg Oral Daily  . simvastatin  20 mg Oral Daily   Continuous Infusions:    LOS: 2 days    Time spent: 30 minutes    Barb Merino, MD [facility] Pager [phone]

## 2020-10-01 NOTE — Progress Notes (Signed)
STROKE TEAM PROGRESS NOTE   INTERVAL HISTORY No visitors at bedside.  Resides at ALF Got up to go bathroom and left leg was useless.  Reports continued weakness. OT is recommending SNF. No eval by PT yet.  He denies snoring  Stroke diagnosis, work up and plan of care was discussed with patient. Questions answered.  Vitals:   09/30/20 1950 09/30/20 2317 10/01/20 0329 10/01/20 0743  BP: (!) 148/79 (!) 157/84 (!) 142/89 (!) 158/77  Pulse: 78 73 76 74  Resp: 20 20 18 20   Temp: 98.1 F (36.7 C) 98.5 F (36.9 C) 97.8 F (36.6 C) 98.3 F (36.8 C)  TempSrc: Oral Oral Oral   SpO2: 93% 95% 98% 93%   CBC:  Recent Labs  Lab 09/30/20 0405  WBC 6.4  HGB 13.1  HCT 40.0  MCV 98.5  PLT 474   Basic Metabolic Panel:  Recent Labs  Lab 09/30/20 0405  NA 139  K 4.1  CL 110  CO2 22  GLUCOSE 99  BUN 18  CREATININE 1.23  CALCIUM 8.4*   Lipid Panel:  Recent Labs  Lab 09/30/20 0405  CHOL 147  TRIG 126  HDL 29*  CHOLHDL 5.1  VLDL 25  LDLCALC 93   HgbA1c:  Recent Labs  Lab 09/30/20 0405  HGBA1C 6.1*   Urine Drug Screen: No results for input(s): LABOPIA, COCAINSCRNUR, LABBENZ, AMPHETMU, THCU, LABBARB in the last 168 hours.  Alcohol Level No results for input(s): ETH in the last 168 hours.  IMAGING past 24 hours CT ANGIO HEAD W OR WO CONTRAST  Result Date: 10/01/2020 CLINICAL DATA:  Follow-up examination for acute stroke. EXAM: CT ANGIOGRAPHY HEAD TECHNIQUE: Multidetector CT imaging of the head was performed using the standard protocol during bolus administration of intravenous contrast. Multiplanar CT image reconstructions and MIPs were obtained to evaluate the vascular anatomy. CONTRAST:  77mL OMNIPAQUE IOHEXOL 350 MG/ML SOLN COMPARISON:  Prior brain MRI from earlier the same day. FINDINGS: CT HEAD Brain: Generalized age-related cerebral atrophy with chronic microvascular ischemic disease again noted. Previously identified small acute ischemic infarct involving the subcortical  right frontal parietal white matter again seen, stable in size as compared to previous. No evidence for hemorrhagic transformation or significant mass effect. No other visible acute large vessel territory infarct. No acute intracranial hemorrhage. No mass lesion, midline shift or mass effect. No hydrocephalus or extra-axial fluid collection. Vascular: No hyperdense vessel. Scattered vascular calcifications noted within the carotid siphons. Skull: Scalp soft tissues and calvarium within normal limits. Sinuses: Patient status post bilateral ocular lens replacement. Mild scattered mucosal thickening noted within the ethmoidal air cells and maxillary sinuses. Paranasal sinuses are otherwise clear. No mastoid effusion. Orbits: Globes and orbital soft tissues within normal limits. CTA HEAD Anterior circulation: Visualized distal cervical segments of the internal carotid arteries are widely patent bilaterally. Petrous segments widely patent. Scattered atheromatous plaque within the cavernous/supraclinoid segments with associated mild to moderate multifocal narrowing (no more than 50%). A1 segments patent bilaterally. Normal anterior communicating artery complex. Anterior cerebral arteries patent to their distal aspects without stenosis. Short-segment mild stenosis at the proximal right M1 segment. M1 segments otherwise widely patent. Normal MCA bifurcations. No proximal M2 branch occlusion. Diffuse small vessel atheromatous irregularity seen throughout the distal MCA branches bilaterally. Posterior circulation: Both vertebral arteries widely patent as they course into the cranial vault. Atheromatous plaque within the proximal right V4 segment with associated mild short-segment stenosis. Vertebral arteries otherwise patent to the vertebrobasilar junction. Both PICA origins patent  and normal. Basilar patent to its distal aspect without stenosis. Superior cerebral arteries patent bilaterally. Both PCAs primarily supplied via  the basilar. Atheromatous irregularity throughout the right PCA without high-grade stenosis. Short-segment severe proximal left P2 stenosis (series 17, image 104). Subsequent downstream distal left P3 occlusion (series 17, image 128). Atheromatous irregularity elsewhere throughout the left PCA distribution. Venous sinuses: Grossly patent allowing for timing the contrast bolus. Anatomic variants: None significant.  No aneurysm. IMPRESSION: CT HEAD IMPRESSION: 1. Stable appearance of evolving small acute ischemic infarct involving the subcortical right frontoparietal white matter. No evidence for hemorrhagic transformation or significant mass effect. 2. No other new acute intracranial abnormality. 3. Atrophy with moderate chronic microvascular ischemic disease. CTA HEAD AND NECK IMPRESSION: 1. Negative CTA for acute large vessel occlusion. 2. Severe short-segment proximal left P2 stenosis with downstream distal left P3 occlusion. 3. Moderate atheromatous irregularity throughout the carotid siphons with associated mild to moderate multifocal narrowing. 4. Additional diffuse small vessel atheromatous irregularity. No other proximal high-grade or correctable stenosis. Electronically Signed   By: Jeannine Boga M.D.   On: 10/01/2020 02:16   DG Chest 1 View  Result Date: 09/30/2020 CLINICAL DATA:  Evaluate for abandoned leads prior to MRI. EXAM: CHEST  1 VIEW COMPARISON:  Chest x-ray from same day. FINDINGS: Unchanged left chest wall pacemaker with leads terminating in the right atrium and right ventricle. No abandoned leads identified. Unchanged cardiomegaly. Normal pulmonary vascularity. No focal consolidation, pleural effusion, or pneumothorax. No acute osseous abnormality. IMPRESSION: 1. No abandoned leads identified. 2. No active disease. Electronically Signed   By: Titus Dubin M.D.   On: 09/30/2020 14:09   MR BRAIN WO CONTRAST  Result Date: 09/30/2020 CLINICAL DATA:  Left leg weakness. EXAM: MRI  HEAD WITHOUT CONTRAST TECHNIQUE: Multiplanar, multiecho pulse sequences of the brain and surrounding structures were obtained without intravenous contrast. COMPARISON:  Head CT 09/29/2020 FINDINGS: Brain: There is a 1 cm acute infarct in the right frontoparietal white matter at the posterior aspect of the centrum semiovale. No intracranial hemorrhage, mass, midline shift, or extra-axial fluid collection is identified. Patchy T2 hyperintensities in the cerebral white matter bilaterally are nonspecific but compatible with moderate chronic small vessel ischemic disease. T2 hyperintensities in the basal ganglia and surrounding white matter tracts bilaterally likely reflect a combination of dilated perivascular spaces and chronic lacunar infarcts. There is mild cerebral atrophy. Vascular: Major intracranial vascular flow voids are preserved. Skull and upper cervical spine: Unremarkable bone marrow signal. Sinuses/Orbits: Bilateral cataract extraction. Minimal mucosal thickening in the paranasal sinuses. Clear mastoid air cells. Other: None. IMPRESSION: 1. Small acute right frontoparietal white matter infarct. 2. Moderate chronic small vessel ischemic disease. Electronically Signed   By: Logan Bores M.D.   On: 09/30/2020 17:00   MR CERVICAL SPINE W WO CONTRAST  Result Date: 09/30/2020 CLINICAL DATA:  Cervical radiculopathy.  Left leg weakness. EXAM: MRI CERVICAL SPINE WITHOUT AND WITH CONTRAST TECHNIQUE: Multiplanar and multiecho pulse sequences of the cervical spine, to include the craniocervical junction and cervicothoracic junction, were obtained without and with intravenous contrast. CONTRAST:  8.73mL GADAVIST GADOBUTROL 1 MMOL/ML IV SOLN COMPARISON:  08/31/2017 FINDINGS: Multiple sequences are moderately motion degraded. Alignment: Unchanged grade 1 retrolisthesis of C3 on C4 and grade 1 anterolisthesis of C7 on T1. Vertebrae: No fracture, suspicious osseous lesion, or significant marrow edema. Cord: No cord  signal abnormality identified within limitations of motion artifact. No abnormal intradural enhancement. Posterior Fossa, vertebral arteries, paraspinal tissues: Preserved vertebral artery flow voids.  No acute finding in the paraspinal soft tissues. Posterior fossa more fully evaluated on separate brain MRI. Disc levels: C2-3: Mild uncovertebral spurring. Right facet ankylosis. No stenosis. C3-4: Retrolisthesis with bulging uncovered disc, uncovertebral spurring, and mild facet arthrosis result in moderate bilateral neural foraminal stenosis, similar to the prior MRI. No significant spinal stenosis. C4-5: Disc bulging/a broad left paracentral disc protrusion, uncovertebral spurring, infolding of the ligamentum flavum, and right greater than left facet arthrosis result in mild spinal stenosis and moderate left greater than right neural foraminal stenosis, similar to the prior MRI. C5-6: Disc bulging, uncovertebral spurring, infolding of the ligamentum flavum, and mild facet arthrosis result in borderline to mild spinal stenosis and moderate left neural foraminal stenosis, similar to the prior MRI. C6-7: Disc bulging and uncovertebral spurring result in mild right and moderate left neural foraminal stenosis, stable to mildly progressed. No significant spinal stenosis. C7-T1: Anterolisthesis with disc uncovering and severe left facet arthrosis without significant stenosis, unchanged. IMPRESSION: Motion degraded examination with similar appearance of diffuse cervical disc degeneration compared to the 2019 MRI. Up to mild spinal stenosis and moderate neural foraminal stenosis at multiple levels as detailed above. Electronically Signed   By: Logan Bores M.D.   On: 09/30/2020 17:11   MR Lumbar Spine W Wo Contrast  Result Date: 09/30/2020 CLINICAL DATA:  Left leg weakness. EXAM: MRI LUMBAR SPINE WITHOUT AND WITH CONTRAST TECHNIQUE: Multiplanar and multiecho pulse sequences of the lumbar spine were obtained without and  with intravenous contrast. CONTRAST:  8.12mL GADAVIST GADOBUTROL 1 MMOL/ML IV SOLN COMPARISON:  11/06/2019 FINDINGS: Segmentation:  Standard. Alignment: Mild lumbar levoscoliosis. Unchanged trace retrolisthesis of L1 on L2 and L2 on L3 and trace anterolisthesis of L4 on L5 and L5 on S1. Vertebrae: No fracture or suspicious osseous lesion. Mild degenerative endplate edema at D6-6, decreased from prior. Conus medullaris and cauda equina: Conus extends to the T12-L1 level. Conus and cauda equina appear normal. Paraspinal and other soft tissues: Partially visualized bilateral renal cysts. Disc levels: Disc desiccation throughout the lumbar spine. Chronic severe disc space narrowing at L2-3 and L5-S1. T12-L1: Negative. L1-2: Mild disc bulging and mild facet arthrosis without stenosis, unchanged. L2-3: Disc bulging, a new or larger right subarticular disc protrusion, endplate spurring, severe disc space height loss, and mild facet and ligamentum flavum hypertrophy result in mild spinal stenosis, moderate to severe right and moderate left lateral recess stenosis, and mild-to-moderate right and mild left neural foraminal stenosis. Potential bilateral L3 nerve root impingement, particularly on the right. L3-4: Disc bulging, a left subarticular to left foraminal disc protrusion, endplate spurring, and mild facet and ligamentum flavum hypertrophy result in mild right and moderate left lateral recess stenosis and mild-to-moderate bilateral neural foraminal stenosis, mildly progressed. No spinal stenosis. L4-5: Anterolisthesis with bulging uncovered disc and moderate facet hypertrophy result in mild spinal stenosis, mild bilateral lateral recess stenosis, and mild right and moderate left neural foraminal stenosis, stable to minimally progressed. Bilateral facet joint effusions which can be seen in the setting of instability. L5-S1: Anterolisthesis with mild bulging of uncovered disc and severe facet hypertrophy result in mild  bilateral neural foraminal stenosis without spinal stenosis, unchanged. Right facet joint ankylosis. IMPRESSION: 1. New/larger right subarticular disc protrusion at L2-3 with progressive right lower greater than left lateral recess stenosis. 2. Mild-to-moderate lateral recess and neural foraminal stenosis at L3-4 and L4-5, mildly progressed. Electronically Signed   By: Logan Bores M.D.   On: 09/30/2020 18:55    PHYSICAL EXAM Constitutional:  Appears well-developed and well-nourished elderly Caucasian male Psych: Affect appropriate to situation, cooperative with examination Eyes: Normal conjunctivae. HENT: No OP obstruction, mildly hard of hearing  Head: Normocephalic and atraumatic without obvious deformity Cardiovascular: Normal rate on cardiac monitor Respiratory: Effort normal, non-labored breathing.  GI: Soft, rounded.There is no tenderness.  Skin: WDI  Neuro: Mental Status: Patient is awake, alert, oriented to person, place, month, year, and situation. Patient is able to give a clear and coherent history. No signs of aphasia or neglect.  Cranial Nerves: II: Visual Fields are full. Pupils are equal, round, and reactive to light 2 mm/ brisk.  III,IV, VI: EOMI without ptosis or diploplia.  V: Facial sensation is symmetric to light touch.  VII: Face is symmetric resting and smiling VIII: Hearing is intact, mildly hard of hearing X: Palate elevates symmetrically. Phonation normal.  XI: Shoulder shrug is symmetric. XII: Tongue protrudes without atrophy or fasciculations.  Motor: Tone is normal. Bulk is normal.  5/5 strength was present in right upper and lower extremities. Left upper extremity slightly weaker than right upper extremity but with 5/5 strength without drift. Left lower extremity with 3/5 strength throughout but with mild drift on double simultaneous testing.  Weakness of ankle dorsiflexors and left hip flexors mainly.  Sensory: Sensation is symmetric to light touch and  temperature in the arms and legs Cerebellar: FNF intact, intact hand rolling,  ASSESSMENT/PLAN  Erik Brock is a 87 y.o. male with a medical history significant for hypertension, coronary artery disease on home aspirin 81 mg daily, chronic diastolic heart failure, permanent pacemaker placement, hypothyroidism, hyperlipidemia, cardiomyopathy, history of lacunar strokes, and degenerative disc disease who presents to [facility] 3/16 as a transfer from [facility] for evaluation of suspected ischemic stroke requiring MRI evaluation. Mr. Rumery resides in a nursing facility and notes that on waking yesterday at around 05:00, he noticed left lower extremity weakness when trying to get out of bed and ambulate to the restroom. He noted that he felt off balance and had trouble with his gait secondary to left lower extremity weakness that has been persistent since onset. On arrival to [facility], he was hypertensive with blood pressures of 200/100 and initial CT imaging was obtained without evidence of acute intracranial process. Due to his permanent pacemaker, he was transferred to [facility] to obtain an MRI of the brain and further evaluation of suspected ischemic stroke.   Stroke:   Small acute right frontoparietal white matter infarct likely d/t SVD CT head 3/15: Atrophy with stable supratentorial small vessel disease. No acute infarct appreciable. No mass or hemorrhage. There are foci of arterial vascular calcification. There is mucosal thickening in several ethmoid air cells.  MRI brain 3/16: 1. Small acute right frontoparietal white matter infarct. 2. Moderate chronic small vessel ischemic disease.  Carotid doppler ultrasound: Right Carotid: Velocities in the right ICA are consistent with a 1-39% stenosis.  Left Carotid: Velocities in the left ICA are consistent with a 1-39%  stenosis.  Vertebrals: Bilateral vertebral arteries demonstrate antegrade flow.  Subclavians: Normal flow  hemodynamics were seen in bilateral subclavian arteries.   Echocardiogram: EF  60 to 65%.  Moderate concentric left ventricular hypertrophy. Left ventricular  diastolic parameters are consistent with Grade I diastolic dysfunction (impaired relaxation). No thrombus, wall motion abnormality or shunt found.       LDL 93  HgbA1c 6.1  VTE prophylaxis -scds    Diet   Diet Heart Room service appropriate? Yes; Fluid consistency: Thin  On ASA 81mg  PTA   DAPT with ASA 81mg  and Plavix 75 mg x 3 weeks and then ASA alone.    Therapy recommendations:  SNF  Disposition:  SNF  Hypertension . Permissive hypertension (OK if < 220/120) but gradually normalize in 5-7 days . Long-term BP goal normotensive  Hyperlipidemia  Home meds: Simvastatin 20mg    LDL 93, not quite  at goal < 70  High intensity statin: Lipitor 40mg  daily   Continue statin at discharge  Other Stroke Risk Factors  Advanced Age >/= 19   Coronary artery disease  Congestive heart failure  Other Active Problems     Hospital day # 2 I have personally obtained history,examined this patient, reviewed notes, independently viewed imaging studies, participated in medical decision making and plan of care.ROS completed by me personally and pertinent positives fully documented  I have made any additions or clarifications directly to the above note. Agree with note above.  Presented with sudden onset of left leg weakness due to right frontal subcortical infarct from small vessel disease.  Recommend aspirin Plavix for 3 weeks followed by Plavix alone.  Increase dose of statin.  Continue ongoing therapies and transfer to skilled nursing facility when bed available.  Greater than 50% time during this 35-minute visit was spent on counseling and coordination of care about use stroke discussion with care team.  Discussed with Dr.Ghimire  Antony Contras, MD Medical Director [place] Pager:  [phone] 10/01/2020 2:00 PM   To contact Stroke Continuity provider, please refer to [email or web address] After hours, contact General Neurology

## 2020-10-02 DIAGNOSIS — I739 Peripheral vascular disease, unspecified: Secondary | ICD-10-CM | POA: Diagnosis not present

## 2020-10-02 DIAGNOSIS — E039 Hypothyroidism, unspecified: Secondary | ICD-10-CM | POA: Diagnosis not present

## 2020-10-02 DIAGNOSIS — R2689 Other abnormalities of gait and mobility: Secondary | ICD-10-CM | POA: Diagnosis not present

## 2020-10-02 DIAGNOSIS — I2510 Atherosclerotic heart disease of native coronary artery without angina pectoris: Secondary | ICD-10-CM | POA: Diagnosis not present

## 2020-10-02 DIAGNOSIS — K219 Gastro-esophageal reflux disease without esophagitis: Secondary | ICD-10-CM | POA: Diagnosis not present

## 2020-10-02 DIAGNOSIS — I63311 Cerebral infarction due to thrombosis of right middle cerebral artery: Secondary | ICD-10-CM | POA: Diagnosis not present

## 2020-10-02 DIAGNOSIS — R29898 Other symptoms and signs involving the musculoskeletal system: Secondary | ICD-10-CM | POA: Diagnosis not present

## 2020-10-02 DIAGNOSIS — I495 Sick sinus syndrome: Secondary | ICD-10-CM | POA: Diagnosis not present

## 2020-10-02 DIAGNOSIS — R279 Unspecified lack of coordination: Secondary | ICD-10-CM | POA: Diagnosis not present

## 2020-10-02 DIAGNOSIS — E785 Hyperlipidemia, unspecified: Secondary | ICD-10-CM | POA: Diagnosis not present

## 2020-10-02 DIAGNOSIS — I5032 Chronic diastolic (congestive) heart failure: Secondary | ICD-10-CM | POA: Diagnosis not present

## 2020-10-02 DIAGNOSIS — R5381 Other malaise: Secondary | ICD-10-CM | POA: Diagnosis not present

## 2020-10-02 DIAGNOSIS — Z743 Need for continuous supervision: Secondary | ICD-10-CM | POA: Diagnosis not present

## 2020-10-02 DIAGNOSIS — Z950 Presence of cardiac pacemaker: Secondary | ICD-10-CM | POA: Diagnosis not present

## 2020-10-02 DIAGNOSIS — M6281 Muscle weakness (generalized): Secondary | ICD-10-CM | POA: Diagnosis not present

## 2020-10-02 DIAGNOSIS — N183 Chronic kidney disease, stage 3 unspecified: Secondary | ICD-10-CM | POA: Diagnosis not present

## 2020-10-02 DIAGNOSIS — N400 Enlarged prostate without lower urinary tract symptoms: Secondary | ICD-10-CM | POA: Diagnosis not present

## 2020-10-02 DIAGNOSIS — R262 Difficulty in walking, not elsewhere classified: Secondary | ICD-10-CM | POA: Diagnosis not present

## 2020-10-02 DIAGNOSIS — N1831 Chronic kidney disease, stage 3a: Secondary | ICD-10-CM | POA: Diagnosis not present

## 2020-10-02 DIAGNOSIS — I639 Cerebral infarction, unspecified: Secondary | ICD-10-CM | POA: Diagnosis not present

## 2020-10-02 DIAGNOSIS — M5412 Radiculopathy, cervical region: Secondary | ICD-10-CM | POA: Diagnosis not present

## 2020-10-02 DIAGNOSIS — I160 Hypertensive urgency: Secondary | ICD-10-CM | POA: Diagnosis not present

## 2020-10-02 MED ORDER — ALBUTEROL SULFATE (2.5 MG/3ML) 0.083% IN NEBU
2.5000 mg | INHALATION_SOLUTION | RESPIRATORY_TRACT | Status: DC | PRN
  Administered 2020-10-02: 2.5 mg via RESPIRATORY_TRACT
  Filled 2020-10-02: qty 3

## 2020-10-02 MED ORDER — ALBUTEROL SULFATE (2.5 MG/3ML) 0.083% IN NEBU: 2.5000 mg | INHALATION_SOLUTION | RESPIRATORY_TRACT | 12 refills | Status: DC | PRN

## 2020-10-02 MED ORDER — ATORVASTATIN CALCIUM 40 MG PO TABS: 40.0000 mg | ORAL_TABLET | Freq: Every day | ORAL | 11 refills | Status: DC

## 2020-10-02 MED ORDER — CLOPIDOGREL BISULFATE 75 MG PO TABS: 75.0000 mg | ORAL_TABLET | Freq: Every day | ORAL | 0 refills | Status: AC

## 2020-10-02 MED ORDER — ASPIRIN 81 MG PO TBEC: 81.0000 mg | DELAYED_RELEASE_TABLET | Freq: Every day | ORAL | 11 refills | Status: DC

## 2020-10-02 NOTE — Care Management Important Message (Signed)
Important Message  Patient Details  Name: Erik Brock MRN: [id number] Date of Birth: 1927/11/15   Medicare Important Message Given:  Yes     Lathyn Griggs [address] 10/02/2020, 2:46 PM

## 2020-10-02 NOTE — Progress Notes (Signed)
STROKE TEAM PROGRESS NOTE   INTERVAL HISTORY No visitors at bedside.  Patient is sitting up in bed.  States is feeling better.  Echocardiogram was unremarkable.  Patient is scheduled to go to [facility] later today.  Vital signs stable.  Neurological exam is unchanged. Vitals:   10/02/20 0458 10/02/20 0805 10/02/20 1056 10/02/20 1143  BP: (!) 177/84 (!) 153/76 (!) 163/72 138/68  Pulse: 74 75  71  Resp: (!) 23 20  (!) 23  Temp: 97.6 F (36.4 C) 98.9 F (37.2 C)  98.2 F (36.8 C)  TempSrc: Oral Oral  Oral  SpO2: 95%   98%   CBC:  Recent Labs  Lab 09/30/20 0405  WBC 6.4  HGB 13.1  HCT 40.0  MCV 98.5  PLT 161   Basic Metabolic Panel:  Recent Labs  Lab 09/30/20 0405  NA 139  K 4.1  CL 110  CO2 22  GLUCOSE 99  BUN 18  CREATININE 1.23  CALCIUM 8.4*   Lipid Panel:  Recent Labs  Lab 09/30/20 0405  CHOL 147  TRIG 126  HDL 29*  CHOLHDL 5.1  VLDL 25  LDLCALC 93   HgbA1c:  Recent Labs  Lab 09/30/20 0405  HGBA1C 6.1*   Urine Drug Screen: No results for input(s): LABOPIA, COCAINSCRNUR, LABBENZ, AMPHETMU, THCU, LABBARB in the last 168 hours.  Alcohol Level No results for input(s): ETH in the last 168 hours.  IMAGING past 24 hours No results found.  PHYSICAL EXAM Constitutional: Appears well-developed and well-nourished elderly Caucasian male Psych: Affect appropriate to situation, cooperative with examination Eyes: Normal conjunctivae. HENT: No OP obstruction, mildly hard of hearing  Head: Normocephalic and atraumatic without obvious deformity Cardiovascular: Normal rate on cardiac monitor Respiratory: Effort normal, non-labored breathing.  GI: Soft, rounded.There is no tenderness.  Skin: WDI  Neuro: Mental Status: Patient is awake, alert, oriented to person, place, month, year, and situation. Patient is able to give a clear and coherent history. No signs of aphasia or neglect.  Cranial Nerves: II: Visual Fields are full. Pupils are equal,  round, and reactive to light 2 mm/ brisk.  III,IV, VI: EOMI without ptosis or diploplia.  V: Facial sensation is symmetric to light touch.  VII: Face is symmetric resting and smiling VIII: Hearing is intact, mildly hard of hearing X: Palate elevates symmetrically. Phonation normal.  XI: Shoulder shrug is symmetric. XII: Tongue protrudes without atrophy or fasciculations.  Motor: Tone is normal. Bulk is normal.  5/5 strength was present in right upper and lower extremities. Left upper extremity slightly weaker than right upper extremity but with 5/5 strength without drift. Left lower extremity with 3/5 strength throughout but with mild drift on double simultaneous testing.  Weakness of ankle dorsiflexors and left hip flexors mainly.  Sensory: Sensation is symmetric to light touch and temperature in the arms and legs Cerebellar: FNF intact, intact hand rolling,  ASSESSMENT/PLAN  Erik Brock is a 87 y.o. male with a medical history significant for hypertension, coronary artery disease on home aspirin 81 mg daily, chronic diastolic heart failure, permanent pacemaker placement, hypothyroidism, hyperlipidemia, cardiomyopathy, history of lacunar strokes, and degenerative disc disease who presents to [facility] 3/16 as a transfer from [facility] for evaluation of suspected ischemic stroke requiring MRI evaluation. Erik Brock resides in a nursing facility and notes that on waking yesterday at around 05:00, he noticed left lower extremity weakness when trying to get out of bed and ambulate to the restroom. He noted that he  felt off balance and had trouble with his gait secondary to left lower extremity weakness that has been persistent since onset. On arrival to [facility], he was hypertensive with blood pressures of 200/100 and initial CT imaging was obtained without evidence of acute intracranial process. Due to his permanent pacemaker, he was transferred to [facility] to obtain an MRI of the brain  and further evaluation of suspected ischemic stroke.   Stroke:   Small acute right frontoparietal white matter infarct likely d/t SVD CT head 3/15: Atrophy with stable supratentorial small vessel disease. No acute infarct appreciable. No mass or hemorrhage. There are foci of arterial vascular calcification. There is mucosal thickening in several ethmoid air cells.  MRI brain 3/16: 1. Small acute right frontoparietal white matter infarct. 2. Moderate chronic small vessel ischemic disease.  Carotid doppler ultrasound: Right Carotid: Velocities in the right ICA are consistent with a 1-39% stenosis.  Left Carotid: Velocities in the left ICA are consistent with a 1-39%  stenosis.  Vertebrals: Bilateral vertebral arteries demonstrate antegrade flow.  Subclavians: Normal flow hemodynamics were seen in bilateral subclavian arteries.   Echocardiogram: EF  60 to 65%.  Moderate concentric left ventricular hypertrophy. Left ventricular  diastolic parameters are consistent with Grade I diastolic dysfunction (impaired relaxation). No thrombus, wall motion abnormality or shunt found.       LDL 93  HgbA1c 6.1  VTE prophylaxis -scds    Diet   Diet Heart Room service appropriate? Yes; Fluid consistency: Thin    On ASA 81mg  PTA   DAPT with ASA 81mg  and Plavix 75 mg x 3 weeks and then ASA alone.    Therapy recommendations:  SNF  Disposition:  SNF  Hypertension . Permissive hypertension (OK if < 220/120) but gradually normalize in 5-7 days . Long-term BP goal normotensive  Hyperlipidemia  Home meds: Simvastatin 20mg    LDL 93, not quite  at goal < 70  High intensity statin: Lipitor 40mg  daily   Continue statin at discharge  Other Stroke Risk Factors  Advanced Age >/= 40   Coronary artery disease  Congestive heart failure  Other Active Problems     Hospital day # 3 Patient is neurologically stable.  Recommend aspirin and Plavix for 3 weeks followed by Plavix alone.   Increase dose of statin.  Continue ongoing therapies and transfer to skilled nursing facility when bed available.  Stroke team will sign off.  Kindly call for questions..  Discussed with Dr.Ghimire  Antony Contras, MD Medical Director [place] Pager: [phone] 10/02/2020 1:47 PM   To contact Stroke Continuity provider, please refer to [email or web address] After hours, contact General Neurology

## 2020-10-02 NOTE — TOC Transition Note (Signed)
Transition of Care [facility] - CM/SW Discharge Note   Patient Details  Name: Erik Brock MRN: [id number] Date of Birth: 1928-04-10  [facility]) CM/SW Contact:  Geralynn Ochs, LCSW Phone Number: 10/02/2020, 11:18 AM   Clinical Narrative:   Nurse to call report to [phone].    Final next level of care: Skilled Nursing Facility Barriers to Discharge: Barriers Resolved   Patient Goals and CMS Choice Patient states their goals for this hospitalization and ongoing recovery are:: to get rehab CMS Medicare.gov Compare Post Acute Care list provided to:: Patient Choice offered to / list presented to : Patient  Discharge Placement              Patient chooses bed at: [facility], [place] Patient to be transferred to facility by: [place] Name of family member notified: Gaspar Bidding Patient and family notified of of transfer: 10/02/20  Discharge Plan and Services     Post Acute Care Choice: [place]                               Social Determinants of Health (SDOH) Interventions     Readmission Risk Interventions No flowsheet data found.

## 2020-10-02 NOTE — Progress Notes (Addendum)
Gave report to [facility] in [place], [place]  to  [place], [place]

## 2020-10-02 NOTE — Plan of Care (Signed)
Pt had to be placed on 2L last night while sleeping. Pt sleeps with his mouth open. No other concerns. Problem: Education: Goal: Knowledge of General Education information will improve Description: Including pain rating scale, medication(s)/side effects and non-pharmacologic comfort measures Outcome: Progressing   Problem: Health Behavior/Discharge Planning: Goal: Ability to manage health-related needs will improve Outcome: Progressing   Problem: Clinical Measurements: Goal: Ability to maintain clinical measurements within normal limits will improve Outcome: Progressing Goal: Will remain free from infection Outcome: Progressing Goal: Diagnostic test results will improve Outcome: Progressing Goal: Respiratory complications will improve Outcome: Progressing Goal: Cardiovascular complication will be avoided Outcome: Progressing   Problem: Activity: Goal: Risk for activity intolerance will decrease Outcome: Progressing   Problem: Nutrition: Goal: Adequate nutrition will be maintained Outcome: Progressing   Problem: Coping: Goal: Level of anxiety will decrease Outcome: Progressing   Problem: Elimination: Goal: Will not experience complications related to bowel motility Outcome: Progressing Goal: Will not experience complications related to urinary retention Outcome: Progressing   Problem: Pain Managment: Goal: General experience of comfort will improve Outcome: Progressing   Problem: Safety: Goal: Ability to remain free from injury will improve Outcome: Progressing   Problem: Skin Integrity: Goal: Risk for impaired skin integrity will decrease Outcome: Progressing   Problem: Education: Goal: Knowledge of disease or condition will improve Outcome: Progressing Goal: Knowledge of secondary prevention will improve Outcome: Progressing Goal: Knowledge of patient specific risk factors addressed and post discharge goals established will improve Outcome:  Progressing Goal: Individualized Educational Video(s) Outcome: Progressing   Problem: Coping: Goal: Will verbalize positive feelings about self Outcome: Progressing Goal: Will identify appropriate support needs Outcome: Progressing   Problem: Health Behavior/Discharge Planning: Goal: Ability to manage health-related needs will improve Outcome: Progressing   Problem: Self-Care: Goal: Verbalization of feelings and concerns over difficulty with self-care will improve Outcome: Progressing Goal: Ability to communicate needs accurately will improve Outcome: Progressing   Problem: Nutrition: Goal: Risk of aspiration will decrease Outcome: Progressing Goal: Dietary intake will improve Outcome: Progressing   Problem: Ischemic Stroke/TIA Tissue Perfusion: Goal: Complications of ischemic stroke/TIA will be minimized Outcome: Progressing

## 2020-10-02 NOTE — Discharge Summary (Signed)
Physician Discharge Summary  Erik Brock GNF:[id number] DOB: [birth year] DOA: 09/29/2020  PCP: Mateo Flow, MD  Admit date: 09/29/2020 Discharge date: 10/02/2020  Admitted From: Assisted living facility Disposition: Skilled nursing facility  Recommendations for Outpatient Follow-up:  1. Follow up with PCP in 1-2 weeks after discharge 2. We will send follow-up to neurology.  Home Health: Not applicable Equipment/Devices: Not applicable  Discharge Condition: Stable CODE STATUS: Full code Diet recommendation: Low-salt diet  Discharge summary: 87 year old gentleman with history of hypertension, coronary artery disease, chronic diastolic heart failure, hypothyroidism, sick sinus syndrome status post pacemaker who presented to [facility] for evaluation of suspected stroke.  He woke up in the morning to go to bathroom and was found off balance and weakness on the left side.  Currently at assisted living facility.  On arrival to the emergency room, seen by tele neurology recommended MRI.  Patient with MRI compatible pacemaker but needed MRI to be done at equipped hospital so transferred to [facility]  Admitted with acute stroke.   Assessment & plan of care:   Acute ischemic stroke, right MCA territory stroke: Clinical findings, left-sided weakness and ataxia. CT head findings, at [facility].  No acute hemorrhage or infarction. MRI of the brain, right frontoparietal acute ischemia. MRI of the C-spine and lumbar spine with chronic degenerative disease.  No acute findings. Carotid Doppler, bilateral 1 to 39% stenosis.  No significant lesion. CTA of the head and neck with moderate atherosclerosis.  No large vessel occlusion. 2D echocardiogram, diastolic dysfunction.  Normal ejection fraction.  No source of emboli. Antiplatelet therapy, on aspirin 81 mg daily.  Additional Plavix 300 mg once loaded, follow-up with 75 mg daily for 3 weeks and continue aspirin 81 mg daily. LDL 93.  On  simvastatin 20 mg at home.  Transitioning to atorvastatin 40 mg daily.    Hemoglobin A1c 6.1.  No indication for treatment. Therapy recommendations, skilled nursing facility rehab. Continue PT OT and inpatient setting at SNF. Follow-up with neurology, referral sent.   Radiculopathy/asymmetrical weakness: Ruled out spinal cause.  C-spine and L-spine MRI with chronic changes.  Continue to work with PT OT.  Coronary artery disease: Without any chest pain.  Currently on aspirin, lisinopril and statin.  Continue.  Chronic diastolic heart failure: Fairly compensated.  Hypothyroidism: On Synthroid.  Sick sinus syndrome status post pacemaker: Functioning well.  Patient is medically stable to transfer to skilled level of care.  Discharge Diagnoses:  Principal Problem:   Hypertensive urgency Active Problems:   CAD (coronary artery disease)   Chronic diastolic heart failure (HCC)   Cardiac pacemaker in situ   Radiculopathy   Left leg weakness   Hypothyroidism   Stroke (cerebrum) [facility])    Discharge Instructions  Discharge Instructions    Ambulatory referral to Neurology   Complete by: As directed    An appointment is requested in approximately: 4 weeks   Diet - low sodium heart healthy   Complete by: As directed    Increase activity slowly   Complete by: As directed      Allergies as of 10/02/2020   No Known Allergies     Medication List    STOP taking these medications   lisinopril-hydrochlorothiazide 20-25 MG tablet Commonly known as: ZESTORETIC   metoprolol tartrate 50 MG tablet Commonly known as: LOPRESSOR   simvastatin 20 MG tablet Commonly known as: ZOCOR   Vitamin D (Ergocalciferol) 1.25 MG (50000 UNIT) Caps capsule Commonly known as: DRISDOL  TAKE these medications   acetaminophen 325 MG tablet Commonly known as: TYLENOL Take 650 mg by mouth every 12 (twelve) hours.   acetaminophen 325 MG tablet Commonly known as: TYLENOL Take 650 mg by mouth  every 6 (six) hours as needed for mild pain or fever.   alendronate 70 MG tablet Commonly known as: FOSAMAX Take 70 mg by mouth once a week. Tuesday   aspirin 81 MG EC tablet Take 1 tablet (81 mg total) by mouth daily. Swallow whole. What changed: additional instructions   atorvastatin 40 MG tablet Commonly known as: Lipitor Take 1 tablet (40 mg total) by mouth daily.   clopidogrel 75 MG tablet Commonly known as: PLAVIX Take 1 tablet (75 mg total) by mouth daily for 21 days.   finasteride 5 MG tablet Commonly known as: PROSCAR Take 5 mg by mouth daily.   gabapentin 300 MG capsule Commonly known as: NEURONTIN Take 300 mg by mouth at bedtime.   levothyroxine 50 MCG tablet Commonly known as: SYNTHROID Take 50 mcg by mouth daily before breakfast.   Lidocaine 4 % Ptch Apply 1 patch topically every 12 (twelve) hours as needed (pain).   lisinopril 20 MG tablet Commonly known as: ZESTRIL Take 20 mg by mouth daily.   melatonin 3 MG Tabs tablet Take 6 mg by mouth at bedtime as needed (sleep).   omeprazole 20 MG capsule Commonly known as: PRILOSEC Take 20 mg by mouth daily.   tiZANidine 4 MG tablet Commonly known as: ZANAFLEX Take 2 mg by mouth daily as needed for muscle spasms (back spasms).       Contact information for after-discharge care    Destination    [facility]  Preferred SNF .   Service: Skilled Nursing Contact information: [place] [phone]                 No Known Allergies  Consultations:  Neurology   Procedures/Studies: CT ANGIO HEAD W OR WO CONTRAST  Result Date: 10/01/2020 CLINICAL DATA:  Follow-up examination for acute stroke. EXAM: CT ANGIOGRAPHY HEAD TECHNIQUE: Multidetector CT imaging of the head was performed using the standard protocol during bolus administration of intravenous contrast. Multiplanar CT image reconstructions and MIPs were obtained to evaluate the vascular  anatomy. CONTRAST:  35mL OMNIPAQUE IOHEXOL 350 MG/ML SOLN COMPARISON:  Prior brain MRI from earlier the same day. FINDINGS: CT HEAD Brain: Generalized age-related cerebral atrophy with chronic microvascular ischemic disease again noted. Previously identified small acute ischemic infarct involving the subcortical right frontal parietal white matter again seen, stable in size as compared to previous. No evidence for hemorrhagic transformation or significant mass effect. No other visible acute large vessel territory infarct. No acute intracranial hemorrhage. No mass lesion, midline shift or mass effect. No hydrocephalus or extra-axial fluid collection. Vascular: No hyperdense vessel. Scattered vascular calcifications noted within the carotid siphons. Skull: Scalp soft tissues and calvarium within normal limits. Sinuses: Patient status post bilateral ocular lens replacement. Mild scattered mucosal thickening noted within the ethmoidal air cells and maxillary sinuses. Paranasal sinuses are otherwise clear. No mastoid effusion. Orbits: Globes and orbital soft tissues within normal limits. CTA HEAD Anterior circulation: Visualized distal cervical segments of the internal carotid arteries are widely patent bilaterally. Petrous segments widely patent. Scattered atheromatous plaque within the cavernous/supraclinoid segments with associated mild to moderate multifocal narrowing (no more than 50%). A1 segments patent bilaterally. Normal anterior communicating artery complex. Anterior cerebral arteries patent to their distal aspects without stenosis.  Short-segment mild stenosis at the proximal right M1 segment. M1 segments otherwise widely patent. Normal MCA bifurcations. No proximal M2 branch occlusion. Diffuse small vessel atheromatous irregularity seen throughout the distal MCA branches bilaterally. Posterior circulation: Both vertebral arteries widely patent as they course into the cranial vault. Atheromatous plaque within  the proximal right V4 segment with associated mild short-segment stenosis. Vertebral arteries otherwise patent to the vertebrobasilar junction. Both PICA origins patent and normal. Basilar patent to its distal aspect without stenosis. Superior cerebral arteries patent bilaterally. Both PCAs primarily supplied via the basilar. Atheromatous irregularity throughout the right PCA without high-grade stenosis. Short-segment severe proximal left P2 stenosis (series 17, image 104). Subsequent downstream distal left P3 occlusion (series 17, image 128). Atheromatous irregularity elsewhere throughout the left PCA distribution. Venous sinuses: Grossly patent allowing for timing the contrast bolus. Anatomic variants: None significant.  No aneurysm. IMPRESSION: CT HEAD IMPRESSION: 1. Stable appearance of evolving small acute ischemic infarct involving the subcortical right frontoparietal white matter. No evidence for hemorrhagic transformation or significant mass effect. 2. No other new acute intracranial abnormality. 3. Atrophy with moderate chronic microvascular ischemic disease. CTA HEAD AND NECK IMPRESSION: 1. Negative CTA for acute large vessel occlusion. 2. Severe short-segment proximal left P2 stenosis with downstream distal left P3 occlusion. 3. Moderate atheromatous irregularity throughout the carotid siphons with associated mild to moderate multifocal narrowing. 4. Additional diffuse small vessel atheromatous irregularity. No other proximal high-grade or correctable stenosis. Electronically Signed   By: Jeannine Boga M.D.   On: 10/01/2020 02:16   DG Chest 1 View  Result Date: 09/30/2020 CLINICAL DATA:  Evaluate for abandoned leads prior to MRI. EXAM: CHEST  1 VIEW COMPARISON:  Chest x-ray from same day. FINDINGS: Unchanged left chest wall pacemaker with leads terminating in the right atrium and right ventricle. No abandoned leads identified. Unchanged cardiomegaly. Normal pulmonary vascularity. No focal  consolidation, pleural effusion, or pneumothorax. No acute osseous abnormality. IMPRESSION: 1. No abandoned leads identified. 2. No active disease. Electronically Signed   By: Titus Dubin M.D.   On: 09/30/2020 14:09   MR BRAIN WO CONTRAST  Result Date: 09/30/2020 CLINICAL DATA:  Left leg weakness. EXAM: MRI HEAD WITHOUT CONTRAST TECHNIQUE: Multiplanar, multiecho pulse sequences of the brain and surrounding structures were obtained without intravenous contrast. COMPARISON:  Head CT 09/29/2020 FINDINGS: Brain: There is a 1 cm acute infarct in the right frontoparietal white matter at the posterior aspect of the centrum semiovale. No intracranial hemorrhage, mass, midline shift, or extra-axial fluid collection is identified. Patchy T2 hyperintensities in the cerebral white matter bilaterally are nonspecific but compatible with moderate chronic small vessel ischemic disease. T2 hyperintensities in the basal ganglia and surrounding white matter tracts bilaterally likely reflect a combination of dilated perivascular spaces and chronic lacunar infarcts. There is mild cerebral atrophy. Vascular: Major intracranial vascular flow voids are preserved. Skull and upper cervical spine: Unremarkable bone marrow signal. Sinuses/Orbits: Bilateral cataract extraction. Minimal mucosal thickening in the paranasal sinuses. Clear mastoid air cells. Other: None. IMPRESSION: 1. Small acute right frontoparietal white matter infarct. 2. Moderate chronic small vessel ischemic disease. Electronically Signed   By: Logan Bores M.D.   On: 09/30/2020 17:00   MR CERVICAL SPINE W WO CONTRAST  Result Date: 09/30/2020 CLINICAL DATA:  Cervical radiculopathy.  Left leg weakness. EXAM: MRI CERVICAL SPINE WITHOUT AND WITH CONTRAST TECHNIQUE: Multiplanar and multiecho pulse sequences of the cervical spine, to include the craniocervical junction and cervicothoracic junction, were obtained without and with intravenous contrast.  CONTRAST:  8.22mL  GADAVIST GADOBUTROL 1 MMOL/ML IV SOLN COMPARISON:  08/31/2017 FINDINGS: Multiple sequences are moderately motion degraded. Alignment: Unchanged grade 1 retrolisthesis of C3 on C4 and grade 1 anterolisthesis of C7 on T1. Vertebrae: No fracture, suspicious osseous lesion, or significant marrow edema. Cord: No cord signal abnormality identified within limitations of motion artifact. No abnormal intradural enhancement. Posterior Fossa, vertebral arteries, paraspinal tissues: Preserved vertebral artery flow voids. No acute finding in the paraspinal soft tissues. Posterior fossa more fully evaluated on separate brain MRI. Disc levels: C2-3: Mild uncovertebral spurring. Right facet ankylosis. No stenosis. C3-4: Retrolisthesis with bulging uncovered disc, uncovertebral spurring, and mild facet arthrosis result in moderate bilateral neural foraminal stenosis, similar to the prior MRI. No significant spinal stenosis. C4-5: Disc bulging/a broad left paracentral disc protrusion, uncovertebral spurring, infolding of the ligamentum flavum, and right greater than left facet arthrosis result in mild spinal stenosis and moderate left greater than right neural foraminal stenosis, similar to the prior MRI. C5-6: Disc bulging, uncovertebral spurring, infolding of the ligamentum flavum, and mild facet arthrosis result in borderline to mild spinal stenosis and moderate left neural foraminal stenosis, similar to the prior MRI. C6-7: Disc bulging and uncovertebral spurring result in mild right and moderate left neural foraminal stenosis, stable to mildly progressed. No significant spinal stenosis. C7-T1: Anterolisthesis with disc uncovering and severe left facet arthrosis without significant stenosis, unchanged. IMPRESSION: Motion degraded examination with similar appearance of diffuse cervical disc degeneration compared to the 2019 MRI. Up to mild spinal stenosis and moderate neural foraminal stenosis at multiple levels as detailed above.  Electronically Signed   By: Logan Bores M.D.   On: 09/30/2020 17:11   MR Lumbar Spine W Wo Contrast  Result Date: 09/30/2020 CLINICAL DATA:  Left leg weakness. EXAM: MRI LUMBAR SPINE WITHOUT AND WITH CONTRAST TECHNIQUE: Multiplanar and multiecho pulse sequences of the lumbar spine were obtained without and with intravenous contrast. CONTRAST:  8.56mL GADAVIST GADOBUTROL 1 MMOL/ML IV SOLN COMPARISON:  11/06/2019 FINDINGS: Segmentation:  Standard. Alignment: Mild lumbar levoscoliosis. Unchanged trace retrolisthesis of L1 on L2 and L2 on L3 and trace anterolisthesis of L4 on L5 and L5 on S1. Vertebrae: No fracture or suspicious osseous lesion. Mild degenerative endplate edema at P3-2, decreased from prior. Conus medullaris and cauda equina: Conus extends to the T12-L1 level. Conus and cauda equina appear normal. Paraspinal and other soft tissues: Partially visualized bilateral renal cysts. Disc levels: Disc desiccation throughout the lumbar spine. Chronic severe disc space narrowing at L2-3 and L5-S1. T12-L1: Negative. L1-2: Mild disc bulging and mild facet arthrosis without stenosis, unchanged. L2-3: Disc bulging, a new or larger right subarticular disc protrusion, endplate spurring, severe disc space height loss, and mild facet and ligamentum flavum hypertrophy result in mild spinal stenosis, moderate to severe right and moderate left lateral recess stenosis, and mild-to-moderate right and mild left neural foraminal stenosis. Potential bilateral L3 nerve root impingement, particularly on the right. L3-4: Disc bulging, a left subarticular to left foraminal disc protrusion, endplate spurring, and mild facet and ligamentum flavum hypertrophy result in mild right and moderate left lateral recess stenosis and mild-to-moderate bilateral neural foraminal stenosis, mildly progressed. No spinal stenosis. L4-5: Anterolisthesis with bulging uncovered disc and moderate facet hypertrophy result in mild spinal stenosis, mild  bilateral lateral recess stenosis, and mild right and moderate left neural foraminal stenosis, stable to minimally progressed. Bilateral facet joint effusions which can be seen in the setting of instability. L5-S1: Anterolisthesis with mild bulging of uncovered  disc and severe facet hypertrophy result in mild bilateral neural foraminal stenosis without spinal stenosis, unchanged. Right facet joint ankylosis. IMPRESSION: 1. New/larger right subarticular disc protrusion at L2-3 with progressive right lower greater than left lateral recess stenosis. 2. Mild-to-moderate lateral recess and neural foraminal stenosis at L3-4 and L4-5, mildly progressed. Electronically Signed   By: Logan Bores M.D.   On: 09/30/2020 18:55   ECHOCARDIOGRAM COMPLETE  Result Date: 09/30/2020    ECHOCARDIOGRAM REPORT   Patient Name:   Erik Brock  Date of Exam: 09/30/2020 Medical Rec #:  [id number]  Height:       67.0 in Accession #:    [id number] Weight:       190.8 lb Date of Birth:  Jan 13, 1928  BSA:          1.982 m Patient Age:    35 years   BP:           159/55 mmHg Patient Gender: M          HR:           76 bpm. Exam Location:  Inpatient Procedure: 2D Echo, Cardiac Doppler and Color Doppler Indications:    Stroke  History:        Patient has no prior history of Echocardiogram examinations.  Sonographer:    Merrie Roof RDCS Referring Phys: [id number] [place]  1. Left ventricular ejection fraction, by estimation, is 60 to 65%. The left ventricle has normal function. The left ventricle has no regional wall motion abnormalities. There is moderate concentric left ventricular hypertrophy. Left ventricular diastolic parameters are consistent with Grade I diastolic dysfunction (impaired relaxation).  2. Right ventricular systolic function is normal. The right ventricular size is normal. Tricuspid regurgitation signal is inadequate for assessing PA pressure.  3. Left atrial size was mildly dilated.  4. The mitral valve is  degenerative. No evidence of mitral valve regurgitation. No evidence of mitral stenosis.  5. The aortic valve is tricuspid. There is moderate calcification of the aortic valve. There is moderate thickening of the aortic valve. Aortic valve regurgitation is mild. Mild to moderate aortic valve sclerosis/calcification is present, without any evidence of aortic stenosis. Conclusion(s)/Recommendation(s): No intracardiac source of embolism detected on this transthoracic study. A transesophageal echocardiogram is recommended to exclude cardiac source of embolism if clinically indicated. FINDINGS  Left Ventricle: Left ventricular ejection fraction, by estimation, is 60 to 65%. The left ventricle has normal function. The left ventricle has no regional wall motion abnormalities. The left ventricular internal cavity size was normal in size. There is  moderate concentric left ventricular hypertrophy. Left ventricular diastolic parameters are consistent with Grade I diastolic dysfunction (impaired relaxation). Right Ventricle: The right ventricular size is normal. No increase in right ventricular wall thickness. Right ventricular systolic function is normal. Tricuspid regurgitation signal is inadequate for assessing PA pressure. Left Atrium: Left atrial size was mildly dilated. Right Atrium: Right atrial size was normal in size. Pericardium: Trivial pericardial effusion is present. Presence of pericardial fat pad. Mitral Valve: The mitral valve is degenerative in appearance. There is mild calcification of the anterior and posterior mitral valve leaflet(s). Mild mitral annular calcification. No evidence of mitral valve regurgitation. No evidence of mitral valve stenosis. Tricuspid Valve: The tricuspid valve is grossly normal. Tricuspid valve regurgitation is not demonstrated. No evidence of tricuspid stenosis. Aortic Valve: The aortic valve is tricuspid. There is moderate calcification of the aortic valve. There is moderate  thickening of the aortic valve. There  is mild aortic valve annular calcification. Aortic valve regurgitation is mild. Mild to moderate aortic valve sclerosis/calcification is present, without any evidence of aortic stenosis. Aortic valve mean gradient measures 8.0 mmHg. Aortic valve peak gradient measures 15.8 mmHg. Aortic valve area, by VTI measures 2.13 cm. Pulmonic Valve: The pulmonic valve was grossly normal. Pulmonic valve regurgitation is not visualized. No evidence of pulmonic stenosis. Aorta: The aortic root and ascending aorta are structurally normal, with no evidence of dilitation. Venous: The inferior vena cava was not well visualized. IAS/Shunts: The atrial septum is grossly normal.  LEFT VENTRICLE PLAX 2D LVIDd:         3.50 cm     Diastology LVIDs:         2.70 cm     LV e' medial:    5.00 cm/s LV PW:         1.30 cm     LV E/e' medial:  15.9 LV IVS:        1.30 cm     LV e' lateral:   6.20 cm/s LVOT diam:     2.10 cm     LV E/e' lateral: 12.8 LV SV:         66 LV SV Index:   33 LVOT Area:     3.46 cm  LV Volumes (MOD) LV vol d, MOD A4C: 89.0 ml LV vol s, MOD A4C: 30.7 ml LV SV MOD A4C:     89.0 ml RIGHT VENTRICLE RV Basal diam:  3.70 cm LEFT ATRIUM             Index       RIGHT ATRIUM           Index LA diam:        4.20 cm 2.12 cm/m  RA Area:     19.40 cm LA Vol (A2C):   81.6 ml 41.17 ml/m RA Volume:   58.00 ml  29.26 ml/m LA Vol (A4C):   73.3 ml 36.98 ml/m LA Biplane Vol: 84.0 ml 42.38 ml/m  AORTIC VALVE AV Area (Vmax):    2.18 cm AV Area (Vmean):   2.06 cm AV Area (VTI):     2.13 cm AV Vmax:           199.00 cm/s AV Vmean:          133.000 cm/s AV VTI:            0.311 m AV Peak Grad:      15.8 mmHg AV Mean Grad:      8.0 mmHg LVOT Vmax:         125.00 cm/s LVOT Vmean:        79.100 cm/s LVOT VTI:          0.191 m LVOT/AV VTI ratio: 0.61  AORTA Ao Root diam: 3.50 cm MITRAL VALVE MV Area (PHT): 3.65 cm     SHUNTS MV Decel Time: 208 msec     Systemic VTI:  0.19 m MV E velocity: 79.60 cm/s    Systemic Diam: 2.10 cm MV A velocity: 125.00 cm/s MV E/A ratio:  0.64 Eleonore Chiquito MD Electronically signed by Eleonore Chiquito MD Signature Date/Time: 09/30/2020/9:53:30 AM    Final    VAS US CAROTID (at [facility] and [facility] only)  Result Date: 09/30/2020 Carotid Arterial Duplex Study Indications:       CVA. Risk Factors:      Hypertension, hyperlipidemia, no history of smoking, coronary  artery disease. Comparison Study:  No previous Performing Technologist: Vonzell Schlatter RVT  Examination Guidelines: A complete evaluation includes B-mode imaging, spectral Doppler, color Doppler, and power Doppler as needed of all accessible portions of each vessel. Bilateral testing is considered an integral part of a complete examination. Limited examinations for reoccurring indications may be performed as noted.  Right Carotid Findings: +----------+--------+--------+--------+------------------+--------+           PSV cm/sEDV cm/sStenosisPlaque DescriptionComments +----------+--------+--------+--------+------------------+--------+ CCA Prox  136     15                                         +----------+--------+--------+--------+------------------+--------+ CCA Distal96      21                                         +----------+--------+--------+--------+------------------+--------+ ICA Prox  44      11      1-39%   heterogenous               +----------+--------+--------+--------+------------------+--------+ ICA Distal49      13                                         +----------+--------+--------+--------+------------------+--------+ ECA       118     20                                         +----------+--------+--------+--------+------------------+--------+ +----------+--------+-------+--------+-------------------+           PSV cm/sEDV cmsDescribeArm Pressure (mmHG) +----------+--------+-------+--------+-------------------+ [id number]                                         +----------+--------+-------+--------+-------------------+ +---------+--------+--+--------+--+ VertebralPSV cm/s65EDV cm/s18 +---------+--------+--+--------+--+  Left Carotid Findings: +----------+--------+--------+--------+------------------+--------+           PSV cm/sEDV cm/sStenosisPlaque DescriptionComments +----------+--------+--------+--------+------------------+--------+ CCA Prox  150     21                                         +----------+--------+--------+--------+------------------+--------+ CCA Distal140     23                                         +----------+--------+--------+--------+------------------+--------+ ICA Prox  101     21      1-39%   heterogenous               +----------+--------+--------+--------+------------------+--------+ ECA       141     23                                         +----------+--------+--------+--------+------------------+--------+ +----------+--------+--------+--------+-------------------+           PSV cm/sEDV cm/sDescribeArm Pressure (mmHG) +----------+--------+--------+--------+-------------------+  [phone]                                         +----------+--------+--------+--------+-------------------+ +---------+--------+--+--------+--+ VertebralPSV cm/s63EDV cm/s20 +---------+--------+--+--------+--+   Summary: Right Carotid: Velocities in the right ICA are consistent with a 1-39% stenosis. Left Carotid: Velocities in the left ICA are consistent with a 1-39% stenosis. Vertebrals:  Bilateral vertebral arteries demonstrate antegrade flow. Subclavians: Normal flow hemodynamics were seen in bilateral subclavian              arteries. *See table(s) above for measurements and observations.  Electronically signed by Antony Contras MD on 09/30/2020 at 12:46:02 PM.    Final    (Echo, Carotid, EGD, Colonoscopy, ERCP)    Subjective: Patient seen and examined.  No overnight events.   Pleasant.  He feels his left lower leg is weaker than left upper arm.  Looking forward to go to [facility].   Discharge Exam: Vitals:   10/02/20 0458 10/02/20 0805  BP: (!) 177/84 (!) 153/76  Pulse: 74 75  Resp: (!) 23 20  Temp: 97.6 F (36.4 C) 98.9 F (37.2 C)  SpO2: 95%    Vitals:   10/01/20 2357 10/02/20 0002 10/02/20 0458 10/02/20 0805  BP: (!) 160/69  (!) 177/84 (!) 153/76  Pulse: 72 71 74 75  Resp: (!) 26 16 (!) 23 20  Temp: 97.8 F (36.6 C)  97.6 F (36.4 C) 98.9 F (37.2 C)  TempSrc: Oral  Oral Oral  SpO2: 93% 92% 95%     General: Pt is alert, awake, not in acute distress Looks comfortable.  Eating breakfast. Cardiovascular: RRR, S1/S2 +, no rubs, no gallops pacemaker present. Respiratory: CTA bilaterally, no wheezing, no rhonchi Abdominal: Soft, NT, ND, bowel sounds + Extremities: no edema, no cyanosis Patient has grossly normal cranial nerves. His left upper and lower extremity power 4/5, lower extremity more weaker than upper extremity.    The results of significant diagnostics from this hospitalization (including imaging, microbiology, ancillary and laboratory) are listed below for reference.     Microbiology: Recent Results (from the past 240 hour(s))  SARS CORONAVIRUS 2 (TAT 6-24 HRS) Nasopharyngeal Nasopharyngeal Swab     Status: None   Collection Time: 10/01/20 11:29 AM   Specimen: Nasopharyngeal Swab  Result Value Ref Range Status   SARS Coronavirus 2 NEGATIVE NEGATIVE Final    Comment: (NOTE) SARS-CoV-2 target nucleic acids are NOT DETECTED.  The SARS-CoV-2 RNA is generally detectable in upper and lower respiratory specimens during the acute phase of infection. Negative results do not preclude SARS-CoV-2 infection, do not rule out co-infections with other pathogens, and should not be used as the sole basis for treatment or other patient management decisions. Negative results must be combined with clinical observations, patient history, and  epidemiological information. The expected result is Negative.  Fact Sheet for Patients: SugarRoll.be  Fact Sheet for Healthcare Providers: https://www.woods-mathews.com/  This test is not yet approved or cleared by the [place] FDA and  has been authorized for detection and/or diagnosis of SARS-CoV-2 by FDA under an Emergency Use Authorization (EUA). This EUA will remain  in effect (meaning this test can be used) for the duration of the COVID-19 declaration under Se ction 564(b)(1) of the Act, 21 U.S.C. section 360bbb-3(b)(1), unless the authorization is terminated or revoked sooner.  Performed at [facility], [place] [address] [place], [place] [address]  Labs: BNP (last 3 results) No results for input(s): BNP in the last 8760 hours. Basic Metabolic Panel: Recent Labs  Lab 09/30/20 0405  NA 139  K 4.1  CL 110  CO2 22  GLUCOSE 99  BUN 18  CREATININE 1.23  CALCIUM 8.4*   Liver Function Tests: Recent Labs  Lab 09/30/20 0405  AST 20  ALT 16  ALKPHOS 27*  BILITOT 1.0  PROT 5.9*  ALBUMIN 3.2*   No results for input(s): LIPASE, AMYLASE in the last 168 hours. No results for input(s): AMMONIA in the last 168 hours. CBC: Recent Labs  Lab 09/30/20 0405  WBC 6.4  HGB 13.1  HCT 40.0  MCV 98.5  PLT 199   Cardiac Enzymes: No results for input(s): CKTOTAL, CKMB, CKMBINDEX, TROPONINI in the last 168 hours. BNP: Invalid input(s): POCBNP CBG: No results for input(s): GLUCAP in the last 168 hours. D-Dimer No results for input(s): DDIMER in the last 72 hours. Hgb A1c Recent Labs    09/30/20 0405  HGBA1C 6.1*   Lipid Profile Recent Labs    09/30/20 0405  CHOL 147  HDL 29*  LDLCALC 93  TRIG 126  CHOLHDL 5.1   Thyroid function studies No results for input(s): TSH, T4TOTAL, T3FREE, THYROIDAB in the last 72 hours.  Invalid input(s): FREET3 Anemia work up No results for input(s): VITAMINB12, FOLATE,  FERRITIN, TIBC, IRON, RETICCTPCT in the last 72 hours. Urinalysis No results found for: COLORURINE, APPEARANCEUR, [place], [place], [place], [place], [place], [place], PROTEINUR, UROBILINOGEN, NITRITE, LEUKOCYTESUR Sepsis Labs Invalid input(s): PROCALCITONIN,  WBC,  LACTICIDVEN Microbiology Recent Results (from the past 240 hour(s))  SARS CORONAVIRUS 2 (TAT 6-24 HRS) Nasopharyngeal Nasopharyngeal Swab     Status: None   Collection Time: 10/01/20 11:29 AM   Specimen: Nasopharyngeal Swab  Result Value Ref Range Status   SARS Coronavirus 2 NEGATIVE NEGATIVE Final    Comment: (NOTE) SARS-CoV-2 target nucleic acids are NOT DETECTED.  The SARS-CoV-2 RNA is generally detectable in upper and lower respiratory specimens during the acute phase of infection. Negative results do not preclude SARS-CoV-2 infection, do not rule out co-infections with other pathogens, and should not be used as the sole basis for treatment or other patient management decisions. Negative results must be combined with clinical observations, patient history, and epidemiological information. The expected result is Negative.  Fact Sheet for Patients: SugarRoll.be  Fact Sheet for Healthcare Providers: https://www.woods-mathews.com/  This test is not yet approved or cleared by the [place] FDA and  has been authorized for detection and/or diagnosis of SARS-CoV-2 by FDA under an Emergency Use Authorization (EUA). This EUA will remain  in effect (meaning this test can be used) for the duration of the COVID-19 declaration under Se ction 564(b)(1) of the Act, 21 U.S.C. section 360bbb-3(b)(1), unless the authorization is terminated or revoked sooner.  Performed at [facility], [place] [address], [place], [place] [address]      Time coordinating discharge:  32 minutes  SIGNED:   Barb Merino, MD  [facility] 10/02/2020, 10:35 AM

## 2020-10-04 DIAGNOSIS — I639 Cerebral infarction, unspecified: Secondary | ICD-10-CM | POA: Diagnosis not present

## 2020-10-04 DIAGNOSIS — E785 Hyperlipidemia, unspecified: Secondary | ICD-10-CM | POA: Diagnosis not present

## 2020-10-04 DIAGNOSIS — R262 Difficulty in walking, not elsewhere classified: Secondary | ICD-10-CM | POA: Diagnosis not present

## 2020-10-04 DIAGNOSIS — N1831 Chronic kidney disease, stage 3a: Secondary | ICD-10-CM | POA: Diagnosis not present

## 2020-10-05 ENCOUNTER — Ambulatory Visit: Payer: Medicare Other | Admitting: Cardiology

## 2020-10-09 ENCOUNTER — Other Ambulatory Visit: Payer: Self-pay | Admitting: *Deleted

## 2020-10-09 NOTE — Patient Outreach (Signed)
Member screened for potential [facility] Care Management needs.  Per [place] (Patient Erik Brock) member resides in [facility].   Communication sent to facility SW to inquire about transition plans. Per chart review, it appears member was in ALF prior.   Will continue to follow for transition plans while member remains in SNF.    Marthenia Rolling, MSN, RN,BSN [place] Acute Care Coordinator [phone] [facility]) [phone]  (Toll free office)

## 2020-10-15 ENCOUNTER — Ambulatory Visit: Payer: Medicare Other | Admitting: Podiatry

## 2020-10-27 ENCOUNTER — Other Ambulatory Visit: Payer: Self-pay | Admitting: *Deleted

## 2020-10-27 NOTE — Patient Outreach (Signed)
[place] Coordinator follow up. Member screened for potential [facility] Care Management needs.  Verified in [place] (Patient Pear[place]) that member returned to ALF from [facility] on 10/27/20.  No identifiable [facility] Care Management needs at this time.    Marthenia Rolling, MSN, RN,BSN [place] Acute Care Coordinator [phone] [facility]) [phone]  (Toll free office)

## 2020-11-03 ENCOUNTER — Ambulatory Visit (INDEPENDENT_AMBULATORY_CARE_PROVIDER_SITE_OTHER): Payer: Medicare Other | Admitting: Adult Health

## 2020-11-03 ENCOUNTER — Encounter: Payer: Self-pay | Admitting: Adult Health

## 2020-11-03 ENCOUNTER — Telehealth: Payer: Self-pay | Admitting: Cardiology

## 2020-11-03 ENCOUNTER — Telehealth: Payer: Self-pay | Admitting: Adult Health

## 2020-11-03 VITALS — BP 159/88 | HR 72 | Ht 67.0 in | Wt 192.0 lb

## 2020-11-03 DIAGNOSIS — R29898 Other symptoms and signs involving the musculoskeletal system: Secondary | ICD-10-CM

## 2020-11-03 DIAGNOSIS — I639 Cerebral infarction, unspecified: Secondary | ICD-10-CM

## 2020-11-03 MED ORDER — PANTOPRAZOLE SODIUM 40 MG PO TBEC: 40.0000 mg | DELAYED_RELEASE_TABLET | Freq: Every day | ORAL | 3 refills | Status: DC

## 2020-11-03 MED ORDER — CLOPIDOGREL BISULFATE 75 MG PO TABS: 75.0000 mg | ORAL_TABLET | Freq: Every day | ORAL | 11 refills | Status: DC

## 2020-11-03 NOTE — Patient Instructions (Signed)
You are recovering well with residual left ankle weakness -continue working with therapy for likely further recovery. May consider use of AFO brace to help with stability but this can be further discussed with PT  Please restart Plavix 75 mg daily and stop aspirin at this time  As you will be starting on Plavix, recommend stopping omeprazole and switching to pantoprazole  Continue atorvastatin for secondary stroke prevention  Continue to monitor your leg swelling especially as your right leg is slightly larger than your left leg. If this worsens, would highly recommend more acute evaluation to rule out clot.  This can be further evaluated by Dr. Bettina Gavia  Continue to follow up with PCP regarding cholesterol and blood pressure management  Maintain strict control of hypertension with blood pressure goal below 130/90 and cholesterol with LDL cholesterol (bad cholesterol) goal below 70 mg/dL.       Followup in the future with me in 3 months or call earlier if needed       Thank you for coming to see [place] at [facility]. I hope we have been able to provide you high quality care today.  You may receive a patient satisfaction survey over the next few weeks. We would appreciate your feedback and comments so that we may continue to improve ourselves and the health of our patients.

## 2020-11-03 NOTE — Telephone Encounter (Signed)
AVS faxed to [place] [phone], [phone] with fax confirmation.

## 2020-11-03 NOTE — Telephone Encounter (Signed)
Pt c/o swelling: STAT is pt has developed SOB within 24 hours  1) How much weight have you gained and in what time span? Yes some not sure   2) If swelling, where is the swelling located? legs  3) Are you currently taking a fluid pill? Patient son not sure  4) Are you currently SOB? No  5) Do you have a log of your daily weights (if so, list)? No   6) Have you gained 3 pounds in a day or 5 pounds in a week? Not sure  7) Have you traveled recently? No patient son calling to get a apt with the doctor however the doctor do not have anything till July. Patient had a stroke on 09/29/20. Please call patient son

## 2020-11-03 NOTE — Progress Notes (Signed)
[facility] [address] [place]. [place] [address] [phone]       HOSPITAL FOLLOW UP NOTE  [name]. Date of Birth:  04-Aug-1927 Medical Record Number:  [id number]   Reason for Referral:  hospital stroke follow up    SUBJECTIVE:   CHIEF COMPLAINT:  Chief Complaint  Patient presents with  . Follow-up    RM 5 with son (brian) Pt is well, no new symptoms     HPI:   Erik Brock a 87 y.o.malewith a medical history significant for hypertension, coronary artery disease on home aspirin 81 mg daily, chronic diastolic heart failure, sick sinus syndrome s/p permanent pacemaker placement, hypothyroidism, hyperlipidemia, cardiomyopathy,history of lacunar strokes,and degenerative disc disease who presented to [facility]as a transfer from [facility] for evaluation of suspected ischemic stroke requiring MRI evaluation with PPM.  Personally reviewed hospitalization pertinent progress notes, lab work and imaging with summary provided.  Evaluated by Dr. Leonie Man.  He resides in a assisted living facility where he noticed left lower extremity weakness upon awakening with imbalance and gait impairment.  Stroke work-up revealed small acute right frontal parietal white matter infarct likely secondary to small vessel disease.  Recommended DAPT for 3 weeks and Plavix alone.  HTN stable.  LDL 93 and initiate atorvastatin 40 mg daily on simvastatin 20 mg daily PTA.  A1c 6.1.  Other stroke risk factors include advanced age, CAD, CHF and prior stroke history.  Evaluated by therapies who recommended discharge to SNF for ongoing therapy needs.    Stroke - small acute right frontal parietal white matter infarct likely secondary to small vessel disease  CTA head atrophy with stable supratentorial small vessel disease; no acute infarct appreciated  CTA head negative LVO; severe short segment proximal left P2 stenosis with downstream distal left P3  occlusion; moderate atheromatous irregularity throughout the carotid siphons with associated mild to moderate multifocal narrowing  MRI  small acute right frontal parietal white matter infarct; moderate chronic small vessel ischemic disease  Carotid duplex b/l ICA 1 to 39% stenosis, VAs antegrade  2D Echo EF 60 to 65% with grade 1 diastolic dysfunction  LDL 93  HgbA1c  6.1  Antithrombotic regimen: On aspirin 81 mg PTA. Now on aspirin Plavix for 3 weeks and Plavix alone  Therapy recommendations: SNF  Disposition:  SNF   Today, 11/03/2020, [name] is being seen for hospital follow-up accompanied by his son, Erik Brock  Reports he has been doing well since discharge with residual LLE distal weakness with continued recovery.  He has since returned back to ALF last week and currently working with [facility] PT/OT.  He has not yet begun walking but is able to stand with PT assistance.  Using Rollator walker PTA.  Denies new stroke/TIA symptoms.  Completed 3 weeks DAPT and remains on aspirin alone without associated side effects (per d/c recommendations) Compliant on atorvastatin 40 mg daily without associated side effects Blood pressure today 159/88 -occasionally monitored at ALF and has been fluctuating anywhere from SBP 140s-170s.  He has not yet had follow-up with PCP or cardiology since discharge  No further concerns at this time     ROS:   14 system review of systems performed and negative with exception of leg weakness and gait impairment  PMH:  Past Medical History:  Diagnosis Date  . Ascending aortic aneurysm ([place]) 09/05/2017  . 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 Bleeding nose   . CAD (coronary artery disease) 12/08/2014  Overview:  Mild, nonobsructive  Overview:  Mild, nonobsructive  . Cardiac pacemaker in situ 12/22/2014  . Cardiomyopathy ([place]) 09/05/2017  . Chronic diastolic heart failure ([place]) 04/05/2015  . Dyslipidemia 04/05/2015  . Elevated PSA 07/30/2013  . Essential (primary)  hypertension 07/30/2013  . GERD (gastroesophageal reflux disease) 07/30/2013  . Gonalgia 11/11/2014  . Hyperlipidemia 07/30/2013  . Hypertension   . Hypertensive heart disease with heart failure ([place]) 04/05/2015  . LBBB (left bundle branch block) 12/08/2014  . Lumbar stenosis 07/30/2013  . Neuritis or radiculitis due to rupture of lumbar intervertebral disc 11/11/2014  . Pacemaker reprogramming/check 12/22/2014  . Thyroid disease   . Trigger point of right shoulder region 10/14/2014    PSH:  Past Surgical History:  Procedure Laterality Date  . APPENDECTOMY    . INSERT / REPLACE / REMOVE PACEMAKER    . tonsills      Social History:  Social History   Socioeconomic History  . Marital status: Widowed    Spouse name: Not on file  . Number of children: Not on file  . Years of education: Not on file  . Highest education level: Not on file  Occupational History  . Not on file  Tobacco Use  . Smoking status: Never Smoker  . Smokeless tobacco: Never Used  Vaping Use  . Vaping Use: Never used  Substance and Sexual Activity  . Alcohol use: No  . Drug use: No  . Sexual activity: Not on file  Other Topics Concern  . Not on file  Social History Narrative  . Not on file   Social Determinants of Health   Financial Resource Strain: Not on file  Food Insecurity: Not on file  Transportation Needs: Not on file  Physical Activity: Not on file  Stress: Not on file  Social Connections: Not on file  Intimate Partner Violence: Not on file    Family History:  Family History  Problem Relation Age of Onset  . Brain cancer Father     Medications:   Current Outpatient Medications on File Prior to Visit  Medication Sig Dispense Refill  . acetaminophen (TYLENOL) 325 MG tablet Take 650 mg by mouth every 12 (twelve) hours.    Marland Kitchen acetaminophen (TYLENOL) 325 MG tablet Take 650 mg by mouth every 6 (six) hours as needed for mild pain or fever.    Marland Kitchen albuterol (PROVENTIL) (2.5 MG/3ML) 0.083%  nebulizer solution Take 3 mLs (2.5 mg total) by nebulization every 4 (four) hours as needed for wheezing or shortness of breath. 75 mL 12  . alendronate (FOSAMAX) 70 MG tablet Take 70 mg by mouth once a week. Tuesday    . atorvastatin (LIPITOR) 40 MG tablet Take 1 tablet (40 mg total) by mouth daily. 30 tablet 11  . finasteride (PROSCAR) 5 MG tablet Take 5 mg by mouth daily.    Marland Kitchen gabapentin (NEURONTIN) 300 MG capsule Take 300 mg by mouth at bedtime.    Marland Kitchen levothyroxine (SYNTHROID, LEVOTHROID) 50 MCG tablet Take 50 mcg by mouth daily before breakfast.    . Lidocaine 4 % PTCH Apply 1 patch topically every 12 (twelve) hours as needed (pain).    Marland Kitchen lisinopril (ZESTRIL) 40 MG tablet Take 40 mg by mouth daily.    . melatonin 3 MG TABS tablet Take 6 mg by mouth at bedtime as needed (sleep).    Marland Kitchen tiZANidine (ZANAFLEX) 4 MG tablet Take 2 mg by mouth daily as needed for muscle spasms (back spasms).  No current facility-administered medications on file prior to visit.    Allergies:  No Known Allergies    OBJECTIVE:  Physical Exam  Vitals:   11/03/20 1051  BP: (!) 159/88  Pulse: 72  Weight: 192 lb (87.1 kg)  Height: 5\' 7"  (1.702 m)   Body mass index is 30.07 kg/m. No exam data present  Depression screen [facility] 2/9 11/03/2020  Decreased Interest 0  Down, Depressed, Hopeless 0  PHQ - 2 Score 0     General: well developed, well nourished,  very pleasant elderly Caucasian male, seated, in no evident distress Head: head normocephalic and atraumatic.   Neck: supple with no carotid or supraclavicular bruits Cardiovascular: regular rate and rhythm, no murmurs Musculoskeletal: no deformity Skin:  no rash/petichiae; RLE +3 pitting edema and LLE +2 pitting edema; no evidence of redness, warmth or pain Vascular:  Normal pulses all extremities   Neurologic Exam Mental Status: Awake and fully alert.   Fluent speech and language.  Oriented to place and time. Recent and remote memory intact.  Attention span, concentration and fund of knowledge appropriate. Mood and affect appropriate.  Cranial Nerves: Fundoscopic exam reveals sharp disc margins. Pupils equal, briskly reactive to light. Extraocular movements full without nystagmus. Visual fields full to confrontation. Hearing intact. Facial sensation intact. Face, tongue, palate moves normally and symmetrically.  Motor: Normal bulk and tone. Normal strength in all tested extremity muscles except LLE 3+-4/5 ADF and APF weakness Sensory.: intact to touch , pinprick , position and vibratory sensation.  Coordination: Rapid alternating movements normal in all extremities. Finger-to-nose and heel-to-shin performed accurately bilaterally. Gait and Station: Deferred as currently nonambulatory Reflexes: 1+ and symmetric. Toes downgoing.     NIHSS  0 Modified Rankin  3      ASSESSMENT: Erik Brock. is a 88 y.o. year old male presented with LLE weakness on 09/29/2020 with stroke work-up revealing small acute right frontal parietal white matter infarct likely secondary to small vessel disease. Vascular risk factors include HTN, HLD, CAD, CHF, SSS s/p pacer, cardiomyopathy, prior strokes on imaging and age.      PLAN:  1. R frontoparietal WM stroke:  a. Residual deficit: LLE distal weakness -encouraged continued participation with [place] therapies and possibly transition to outpatient therapies once completed if indicated.  Recommend discussion with PT for possible benefit of AFO brace to help with ankle stability if and when he starts to ambulate b. Advised to restart clopidogrel 75 mg daily and stop aspirin as he was on aspirin PTA (per Dr. Clydene Fake recommendation during hospitalization) and continue atorvastatin for secondary stroke prevention. Current use of omeprazole for GERD -recommend switching to pantoprazole with use of clopidogrel c. Discussed secondary stroke prevention measures and importance of close PCP follow up for  aggressive stroke risk factor management  2. HTN: BP goal <130/90.  Elevated today on current regimen -advised follow-up with PCP and cardiology 3. HLD: LDL goal <70. Recent LDL 93 - on atorvastatin 40 mg daily.  Request follow-up with PCP in the next 1 to 2 months for repeat lipid panel and ongoing prescribing of statin and routine monitoring 4. Lower extremity edema: discussed concern for R>L lower extremity swelling.  No definite symptom of DVT such as warmth, redness or pain. Discussed further evaluation with lower extremity ultrasound to rule out DVT but patient and son wish to schedule follow-up with cardiology for further evaluation.  Advised to continue to monitor and if edema should worsen or DVT symptoms present, acute  evaluation indicated    Follow up in 3 months or call earlier if needed   CC:  [place] provider: Dr. Leonie Man PCP: Mateo Flow, MD   Cardiology: Shirlee More MD   I spent 45 minutes of face-to-face and non-face-to-face time with patient and son.  This included previsit chart review including recent hospitalization pertinent progress notes, lab work and imaging, lab review, study review, order entry, electronic health record documentation, patient education regarding recent stroke and etiology, residual deficits, importance of managing stroke risk factors, lower extremity swelling and answered all other questions to patient and sons satisfaction  Frann Rider, AGNP-BC  [facility] [address] [place], [place] [address]  Phone [phone] Fax [phone] Note: This document was prepared with digital dictation and possible smart phrase technology. Any transcriptional errors that result from this process are unintentional.

## 2020-11-03 NOTE — Telephone Encounter (Signed)
[facility]) called, need a copy refaxed with physician signature. Would like a call from the nurse  Contact info: [phone] Fax: [phone]

## 2020-11-03 NOTE — Telephone Encounter (Signed)
Pt's son is asking that the changes to pt's medication be faxed to pt's Nursing home [facility] [phone] ph# [phone]

## 2020-11-03 NOTE — Telephone Encounter (Signed)
Spoke to the patients son just now and he requested that we make an appointment for the patient to see Dr. Bettina Gavia. At this time Dr. Bettina Gavia is fully booked for several months and the patients son was agreeable to have him see Dr. Harriet Masson so that he can be seen sooner. I got them scheduled and they are agreeable to the plan of care.    Encouraged patient to call back with any questions or concerns.

## 2020-11-03 NOTE — Telephone Encounter (Signed)
I called to f/u on fax of AVS with medication instructions for pt.  They will get someone to call me back.

## 2020-11-03 NOTE — Telephone Encounter (Signed)
Office note printed and signed to fax to [phone] with medication recommendations.

## 2020-11-04 ENCOUNTER — Telehealth: Payer: Self-pay | Admitting: Adult Health

## 2020-11-04 NOTE — Telephone Encounter (Signed)
Re: pt's appointment on yesterday, [employer] states she needs signed orders for the changes to pt's meds. They can be faxed to [phone]

## 2020-11-04 NOTE — Telephone Encounter (Signed)
Faxed orders to [phone] [facility] [employer].  Received fax confirmation. (along with ovf note for records).

## 2020-11-09 NOTE — Progress Notes (Signed)
I agree with the above plan 

## 2020-11-12 ENCOUNTER — Ambulatory Visit (INDEPENDENT_AMBULATORY_CARE_PROVIDER_SITE_OTHER): Payer: Medicare Other

## 2020-11-12 DIAGNOSIS — I429 Cardiomyopathy, unspecified: Secondary | ICD-10-CM | POA: Diagnosis not present

## 2020-11-12 LAB — CUP PACEART REMOTE DEVICE CHECK
Battery Remaining Longevity: 55 mo
Battery Voltage: 2.99 V
Brady Statistic AP VP Percent: 1.15 %
Brady Statistic AP VS Percent: 41.32 %
Brady Statistic AS VP Percent: 0.65 %
Brady Statistic AS VS Percent: 56.88 %
Brady Statistic RA Percent Paced: 40.74 %
Brady Statistic RV Percent Paced: 1.89 %
Date Time Interrogation Session: 20220427135202
Implantable Lead Implant Date: 20160524
Implantable Lead Implant Date: 20160524
Implantable Lead Location: 753859
Implantable Lead Location: 753860
Implantable Lead Model: 5076
Implantable Lead Model: 5076
Implantable Pulse Generator Implant Date: 20160524
Lead Channel Impedance Value: 380 Ohm
Lead Channel Impedance Value: 456 Ohm
Lead Channel Impedance Value: 475 Ohm
Lead Channel Impedance Value: 551 Ohm
Lead Channel Pacing Threshold Amplitude: 0.625 V
Lead Channel Pacing Threshold Amplitude: 0.625 V
Lead Channel Pacing Threshold Pulse Width: 0.4 ms
Lead Channel Pacing Threshold Pulse Width: 0.4 ms
Lead Channel Sensing Intrinsic Amplitude: 1.25 mV
Lead Channel Sensing Intrinsic Amplitude: 1.25 mV
Lead Channel Sensing Intrinsic Amplitude: 20.125 mV
Lead Channel Sensing Intrinsic Amplitude: 20.125 mV
Lead Channel Setting Pacing Amplitude: 1.25 V
Lead Channel Setting Pacing Amplitude: 2 V
Lead Channel Setting Pacing Pulse Width: 0.4 ms
Lead Channel Setting Sensing Sensitivity: 0.9 mV

## 2020-11-16 ENCOUNTER — Encounter: Payer: Self-pay | Admitting: Cardiology

## 2020-11-16 ENCOUNTER — Other Ambulatory Visit: Payer: Self-pay

## 2020-11-16 ENCOUNTER — Ambulatory Visit (INDEPENDENT_AMBULATORY_CARE_PROVIDER_SITE_OTHER): Payer: Medicare Other | Admitting: Cardiology

## 2020-11-16 VITALS — BP 130/78 | HR 75 | Ht 67.0 in | Wt 195.8 lb

## 2020-11-16 DIAGNOSIS — I10 Essential (primary) hypertension: Secondary | ICD-10-CM

## 2020-11-16 DIAGNOSIS — E669 Obesity, unspecified: Secondary | ICD-10-CM | POA: Insufficient documentation

## 2020-11-16 DIAGNOSIS — I2510 Atherosclerotic heart disease of native coronary artery without angina pectoris: Secondary | ICD-10-CM

## 2020-11-16 DIAGNOSIS — E782 Mixed hyperlipidemia: Secondary | ICD-10-CM | POA: Diagnosis not present

## 2020-11-16 DIAGNOSIS — Z8673 Personal history of transient ischemic attack (TIA), and cerebral infarction without residual deficits: Secondary | ICD-10-CM

## 2020-11-16 MED ORDER — FUROSEMIDE 40 MG PO TABS: 40.0000 mg | ORAL_TABLET | ORAL | 3 refills | Status: DC

## 2020-11-16 MED ORDER — POTASSIUM CHLORIDE CRYS ER 20 MEQ PO TBCR: 20.0000 meq | EXTENDED_RELEASE_TABLET | ORAL | 3 refills | Status: DC

## 2020-11-16 NOTE — Addendum Note (Signed)
Addended by: Orvan July on: 11/16/2020 11:02 AM   Modules accepted: Orders

## 2020-11-16 NOTE — Progress Notes (Signed)
Cardiology Office Note:    Date:  11/16/2020   ID:  Nolen Mu., DOB 12-13-1927, MRN [id number]  PCP:  Mateo Flow, MD  Cardiologist:  No primary care provider on file.  Electrophysiologist:  Will Meredith Leeds, MD   Referring MD: Mateo Flow, MD   I am having some leg swelling  History of Present Illness:    Henson Fraticelli. is a 87 y.o. male with a hx of hypertension, coronary artery disease, chronic diastolic heart failure, hypothyroidism, sick sinus syndrome status post pacemaker the patient usually follows with Dr. Bettina Gavia.   Since he was last seen by Dr. Bettina Gavia the patient has had a acute ischemic stroke right MCA territory at which time he initially presented to [facility] and was subsequently transferred to [facility].  It appears he was discharged on October 02, 2020 to a skilled nursing facility.  Recently the patient son requested that the patient be seen by cardiology.  Since his hospitalization the patient and son reported he has had increasing leg edema.  Past Medical History:  Diagnosis Date  . Ascending aortic aneurysm ([place]) 09/05/2017  . 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 Bleeding nose   . CAD (coronary artery disease) 12/08/2014   Overview:  Mild, nonobsructive  Overview:  Mild, nonobsructive  . Cardiac pacemaker in situ 12/22/2014  . Cardiomyopathy ([place]) 09/05/2017  . Chronic diastolic heart failure ([place]) 04/05/2015  . Dyslipidemia 04/05/2015  . Elevated PSA 07/30/2013  . Essential (primary) hypertension 07/30/2013  . GERD (gastroesophageal reflux disease) 07/30/2013  . Gonalgia 11/11/2014  . Hyperlipidemia 07/30/2013  . Hypertension   . Hypertensive heart disease with heart failure ([place]) 04/05/2015  . LBBB (left bundle branch block) 12/08/2014  . Lumbar stenosis 07/30/2013  . Neuritis or radiculitis due to rupture of lumbar intervertebral disc 11/11/2014  . Pacemaker reprogramming/check 12/22/2014  . Thyroid disease   . Trigger point of right  shoulder region 10/14/2014    Past Surgical History:  Procedure Laterality Date  . APPENDECTOMY    . INSERT / REPLACE / REMOVE PACEMAKER    . tonsills      Current Medications: Current Meds  Medication Sig  . albuterol (PROVENTIL) (2.5 MG/3ML) 0.083% nebulizer solution Take 3 mLs (2.5 mg total) by nebulization every 4 (four) hours as needed for wheezing or shortness of breath.  Marland Kitchen alendronate (FOSAMAX) 70 MG tablet Take 70 mg by mouth once a week. Tuesday  . atorvastatin (LIPITOR) 40 MG tablet Take 1 tablet (40 mg total) by mouth daily.  . clopidogrel (PLAVIX) 75 MG tablet Take 1 tablet (75 mg total) by mouth daily.  . finasteride (PROSCAR) 5 MG tablet Take 5 mg by mouth daily.  Marland Kitchen gabapentin (NEURONTIN) 300 MG capsule Take 300 mg by mouth at bedtime.  Marland Kitchen levothyroxine (SYNTHROID, LEVOTHROID) 50 MCG tablet Take 50 mcg by mouth daily before breakfast.  . Lidocaine 4 % PTCH Apply 1 patch topically every 12 (twelve) hours as needed (pain).  Marland Kitchen lisinopril (ZESTRIL) 40 MG tablet Take 40 mg by mouth daily.  . melatonin 3 MG TABS tablet Take 6 mg by mouth at bedtime as needed (sleep).  . pantoprazole (PROTONIX) 40 MG tablet Take 1 tablet (40 mg total) by mouth daily.  Marland Kitchen tiZANidine (ZANAFLEX) 4 MG tablet Take 2 mg by mouth daily as needed for muscle spasms (back spasms).  . [DISCONTINUED] acetaminophen (TYLENOL) 325 MG tablet Take 650 mg by mouth every 6 (six) hours as needed  for mild pain or fever.     Allergies:   Patient has no known allergies.   Social History   Socioeconomic History  . Marital status: Widowed    Spouse name: Not on file  . Number of children: Not on file  . Years of education: Not on file  . Highest education level: Not on file  Occupational History  . Not on file  Tobacco Use  . Smoking status: Never Smoker  . Smokeless tobacco: Never Used  Vaping Use  . Vaping Use: Never used  Substance and Sexual Activity  . Alcohol use: No  . Drug use: No  . Sexual  activity: Not on file  Other Topics Concern  . Not on file  Social History Narrative  . Not on file   Social Determinants of Health   Financial Resource Strain: Not on file  Food Insecurity: Not on file  Transportation Needs: Not on file  Physical Activity: Not on file  Stress: Not on file  Social Connections: Not on file     Family History: The patient's family history includes Brain cancer in his father.  ROS:   Review of Systems  Constitution: Negative for decreased appetite, fever and weight gain.  HENT: Negative for congestion, ear discharge, hoarse voice and sore throat.   Eyes: Negative for discharge, redness, vision loss in right eye and visual halos.  Cardiovascular: Increasing bilateral leg swelling negative for chest pain, dyspnea on exertion, orthopnea and palpitations.  Respiratory: Negative for cough, hemoptysis, shortness of breath and snoring.   Endocrine: Negative for heat intolerance and polyphagia.  Hematologic/Lymphatic: Negative for bleeding problem. Does not bruise/bleed easily.  Skin: Negative for flushing, nail changes, rash and suspicious lesions.  Musculoskeletal: Negative for arthritis, joint pain, muscle cramps, myalgias, neck pain and stiffness.  Gastrointestinal: Negative for abdominal pain, bowel incontinence, diarrhea and excessive appetite.  Genitourinary: Negative for decreased libido, genital sores and incomplete emptying.  Neurological: Negative for brief paralysis, focal weakness, headaches and loss of balance.  Psychiatric/Behavioral: Negative for altered mental status, depression and suicidal ideas.  Allergic/Immunologic: Negative for HIV exposure and persistent infections.    EKGs/Labs/Other Studies Reviewed:    The following studies were reviewed today:   EKG:  None today  Echocardi[facility] done in March 2022 at [facility] EF 45 to 50%.  Diastolic filling pattern was relaxed.  Right ventricle normal in size and function.  Left  atrium normal in size.  Right atrium is normal in size..  Interatrial interventricular septum intact.  There is mild aortic valve sclerosis.  There is mild aortic regurgitation.  Trace mitral regurgitation.  Tricuspid valve is normal in size.  Pulmonic valve is not well visualized.  Recent Labs: 09/30/2020: ALT 16; BUN 18; Creatinine, Ser 1.23; Hemoglobin 13.1; Platelets 199; Potassium 4.1; Sodium 139  Recent Lipid Panel    Component Value Date/Time   CHOL 147 09/30/2020 0405   CHOL 164 04/07/2020 1048   TRIG 126 09/30/2020 0405   HDL 29 (L) 09/30/2020 0405   HDL 39 (L) 04/07/2020 1048   CHOLHDL 5.1 09/30/2020 0405   VLDL 25 09/30/2020 0405   LDLCALC 93 09/30/2020 0405   LDLCALC 96 04/07/2020 1048    Physical Exam:    VS:  BP 130/78   Pulse 75   Ht 5\' 7"  (1.702 m)   Wt 195 lb 12.8 oz (88.8 kg)   SpO2 96%   BMI 30.67 kg/m     Wt Readings from Last 3 Encounters:  11/16/20 195 lb 12.8 oz (88.8 kg)  11/03/20 192 lb (87.1 kg)  04/07/20 190 lb 12.8 oz (86.5 kg)     GEN: Well nourished, well developed in no acute distress HEENT: Normal NECK: No JVD; No carotid bruits LYMPHATICS: No lymphadenopathy CARDIAC: S1S2 noted,RRR, no murmurs, rubs, gallops RESPIRATORY:  Clear to auscultation without rales, wheezing or rhonchi  ABDOMEN: Soft, non-tender, non-distended, +bowel sounds, no guarding. EXTREMITIES: No edema, No cyanosis, no clubbing MUSCULOSKELETAL:  No deformity  SKIN: Warm and dry NEUROLOGIC:  Alert and oriented x 3, non-focal PSYCHIATRIC:  Normal affect, good insight  ASSESSMENT:    1. Coronary artery disease involving native coronary artery of native heart without angina pectoris   2. History of CVA (cerebrovascular accident)   3. Hypertension, unspecified type   4. Mixed hyperlipidemia   5. Obesity (BMI 30.0-34.9)    PLAN:     He does have worsening bilateral leg edema.  We will start the patient back on loop diuretic.  Lasix 40 mg every other day with  potassium supplement.  In meantime I am going to get blood work today.  No anginal symptoms.  He appears to be recovering well from his recent CVA.  The patient is in agreement with the above plan. The patient left the office in stable condition.  The patient will follow up in 8 weeks with Dr. Bettina Gavia.   Medication Adjustments/Labs and Tests Ordered: Current medicines are reviewed at length with the patient today.  Concerns regarding medicines are outlined above.  No orders of the defined types were placed in this encounter.  No orders of the defined types were placed in this encounter.   There are no Patient Instructions on file for this visit.   Adopting a Healthy Lifestyle.  Know what a healthy weight is for you (roughly BMI <25) and aim to maintain this   Aim for 7+ servings of fruits and vegetables daily   65-80+ fluid ounces of water or unsweet tea for healthy kidneys   Limit to max 1 drink of alcohol per day; avoid smoking/tobacco   Limit animal fats in diet for cholesterol and heart health - choose grass fed whenever available   Avoid highly processed foods, and foods high in saturated/trans fats   Aim for low stress - take time to unwind and care for your mental health   Aim for 150 min of moderate intensity exercise weekly for heart health, and weights twice weekly for bone health   Aim for 7-9 hours of sleep daily   When it comes to diets, agreement about the perfect plan isnt easy to find, even among the experts. Experts at the [employer] developed an idea known as the Healthy Eating Plate. Just imagine a plate divided into logical, healthy portions.   The emphasis is on diet quality:   Load up on vegetables and fruits - one-half of your plate: Aim for color and variety, and remember that potatoes dont count.   Go for whole grains - one-quarter of your plate: Whole wheat, barley, wheat berries, quinoa, oats, brown rice, and foods made  with them. If you want pasta, go with whole wheat pasta.   Protein power - one-quarter of your plate: Fish, chicken, beans, and nuts are all healthy, versatile protein sources. Limit red meat.   The diet, however, does go beyond the plate, offering a few other suggestions.   Use healthy plant oils, such as olive, canola, soy, corn, sunflower and peanut.  Check the labels, and avoid partially hydrogenated oil, which have unhealthy trans fats.   If youre thirsty, drink water. Coffee and tea are good in moderation, but skip sugary drinks and limit milk and dairy products to one or two daily servings.   The type of carbohydrate in the diet is more important than the amount. Some sources of carbohydrates, such as vegetables, fruits, whole grains, and beans-are healthier than others.   Finally, stay active  Signed, Berniece Salines, DO  11/16/2020 10:45 AM    [place]

## 2020-11-16 NOTE — Patient Instructions (Signed)
Medication Instructions:  Your physician has recommended you make the following change in your medication:  START: Lasix 40 mg every other day START: Potassium 20 meq every other day *If you need a refill on your cardiac medications before your next appointment, please call your pharmacy*   Lab Work: Your physician recommends that you return for lab work: TODAY: BMET, [place] If you have labs (blood work) drawn today and your tests are completely normal, you will receive your results only by: Marland Kitchen MyChart Message (if you have MyChart) OR . A paper copy in the mail If you have any lab test that is abnormal or we need to change your treatment, we will call you to review the results.   Testing/Procedures: None   Follow-Up: At [facility], you and your health needs are our priority.  As part of our continuing mission to provide you with exceptional heart care, we have created designated Provider Care Teams.  These Care Teams include your primary Cardiologist (physician) and Advanced Practice Providers (APPs -  Physician Assistants and Nurse Practitioners) who all work together to provide you with the care you need, when you need it.  We recommend signing up for the patient portal called "MyChart".  Sign up information is provided on this After Visit Summary.  MyChart is used to connect with patients for Virtual Visits (Telemedicine).  Patients are able to view lab/test results, encounter notes, upcoming appointments, etc.  Non-urgent messages can be sent to your provider as well.   To learn more about what you can do with MyChart, go to [email or web address].    Your next appointment:   8 week(s)  The format for your next appointment:   In Person  Provider:   Shirlee More, MD   Other Instructions

## 2020-11-17 LAB — BASIC METABOLIC PANEL
BUN/Creatinine Ratio: 13 (ref 10–24)
BUN: 15 mg/dL (ref 10–36)
CO2: 20 mmol/L (ref 20–29)
Calcium: 8.2 mg/dL — ABNORMAL LOW (ref 8.6–10.2)
Chloride: 107 mmol/L — ABNORMAL HIGH (ref 96–106)
Creatinine, Ser: 1.12 mg/dL (ref 0.76–1.27)
Glucose: 109 mg/dL — ABNORMAL HIGH (ref 65–99)
Potassium: 4.6 mmol/L (ref 3.5–5.2)
Sodium: 138 mmol/L (ref 134–144)
eGFR: 62 mL/min/{1.73_m2} (ref >59)

## 2020-11-17 LAB — MAGNESIUM: Magnesium: 2 mg/dL (ref 1.6–2.3)

## 2020-12-02 NOTE — Progress Notes (Signed)
Remote pacemaker transmission.   

## 2021-01-13 NOTE — Progress Notes (Signed)
Cardiology Office Note:    Date:  01/14/2021   ID:  Erik Mu., DOB 1928-02-11, MRN [id number]  PCP:  Erik Flow, MD  Cardiologist:  Erik More, MD    Referring MD: Erik Flow, MD    ASSESSMENT:    1. Coronary artery disease involving native coronary artery of native heart without angina pectoris   2. Hypertensive heart disease with heart failure (HCC)   3. Cardiac pacemaker in situ   4. Mixed hyperlipidemia    PLAN:    In order of problems listed above:  He has made a good functional recovery from his stroke and now is in assisted living continue his medical therapy including clopidogrel statin and has had no anginal discomfort. BP is elevated he is edematous he will increase his diuretic to daily trend blood pressures daily up to his nursing home and a copy to be faxed to me in 2 weeks. Stable pacemaker function Lipids at target continue statin with CAD and stroke   Next appointment: 6 months   Medication Adjustments/Labs and Tests Ordered: Current medicines are reviewed at length with the patient today.  Concerns regarding medicines are outlined above.  No orders of the defined types were placed in this encounter.  Meds ordered this encounter  Medications   furosemide (LASIX) 40 MG tablet    Sig: Take 1 tablet (40 mg total) by mouth daily.    Dispense:  90 tablet    Refill:  3     Chief Complaint  Patient presents with   Follow-up   Coronary Artery Disease   Congestive Heart Failure     History of Present Illness:    Erik Brock. is a 87 y.o. male with a hx of hypertension coronary artery disease chronic diastolic heart failure hypothyroidism sick sinus syndrome with permanent dual-chamber pacemaker and right middle cerebral artery territory stroke last seen 11/16/2020.  Compliance with diet, lifestyle and medications: Yes He is seen along with his son and participates in the evaluation and decision making.   He has made a good  functional recovery from his stroke and now lives at [place]. He has developed edema despite taking a diuretic every other day is still present he has some oozing from his skin and is adding salt to his food. Fortunately has had no shortness of breath chest pain palpitation or syncope. Recent labs 09/30/2020 shows a cholesterol 147 LDL 93 triglycerides 126 HDL 29 A1c 6.1% Recent device download pacemaker 11/11/2020 showed normal parameters and device function.  He is ventricularly paced approximately 2% of the time but is atrially paced close to 100%..   Acute ischemic stroke, right MCA territory stroke:09/29/2020 Clinical findings, left-sided weakness and ataxia. CT head findings, at [facility].  No acute hemorrhage or infarction. MRI of the brain, right frontoparietal acute ischemia. MRI of the C-spine and lumbar spine with chronic degenerative disease.  No acute findings. Carotid Doppler, bilateral 1 to 39% stenosis.  No significant lesion. CTA of the head and neck with moderate atherosclerosis.  No large vessel occlusion. 2D echocardiogram, diastolic dysfunction.  Normal ejection fraction.  No source of emboli. Antiplatelet therapy, on aspirin 81 mg daily.  Additional Plavix 300 mg once loaded, follow-up with 75 mg daily for 3 weeks and continue aspirin 81 mg daily. LDL 93.  On simvastatin 20 mg at home.  Transitioning to atorvastatin 40 mg daily.    Hemoglobin A1c 6.1.  No indication for treatment. Therapy recommendations, skilled nursing  facility rehab. Continue PT OT and inpatient setting at SNF. Follow-up with neurology, referral sent Past Medical History:  Diagnosis Date   Ascending aortic aneurysm ([place]) 09/05/2017   AV block, Mobitz II 12/08/2014   Bleeding nose    CAD (coronary artery disease) 12/08/2014   Overview:  Mild, nonobsructive  Overview:  Mild, nonobsructive   Cardiac pacemaker in situ 12/22/2014   Cardiomyopathy ([place]) 09/05/2017   Chronic diastolic heart failure  ([place]) 04/05/2015   Dyslipidemia 04/05/2015   Elevated PSA 07/30/2013   Essential (primary) hypertension 07/30/2013   GERD (gastroesophageal reflux disease) 07/30/2013   Gonalgia 11/11/2014   Hyperlipidemia 07/30/2013   Hypertension    Hypertensive heart disease with heart failure ([place]) 04/05/2015   LBBB (left bundle branch block) 12/08/2014   Lumbar stenosis 07/30/2013   Neuritis or radiculitis due to rupture of lumbar intervertebral disc 11/11/2014   Pacemaker reprogramming/check 12/22/2014   Thyroid disease    Trigger point of right shoulder region 10/14/2014    Past Surgical History:  Procedure Laterality Date   APPENDECTOMY     INSERT / REPLACE / REMOVE PACEMAKER     tonsills      Current Medications: Current Meds  Medication Sig   acetaminophen (TYLENOL) 325 MG tablet Take 650 mg by mouth every 6 (six) hours as needed.   albuterol (PROVENTIL) (2.5 MG/3ML) 0.083% nebulizer solution Take 3 mLs (2.5 mg total) by nebulization every 4 (four) hours as needed for wheezing or shortness of breath.   alendronate (FOSAMAX) 70 MG tablet Take 70 mg by mouth once a week. Tuesday   atorvastatin (LIPITOR) 40 MG tablet Take 1 tablet (40 mg total) by mouth daily.   clopidogrel (PLAVIX) 75 MG tablet Take 1 tablet (75 mg total) by mouth daily.   finasteride (PROSCAR) 5 MG tablet Take 5 mg by mouth daily.   gabapentin (NEURONTIN) 300 MG capsule Take 300 mg by mouth at bedtime.   levothyroxine (SYNTHROID, LEVOTHROID) 50 MCG tablet Take 50 mcg by mouth daily before breakfast.   Lidocaine 4 % PTCH Apply 1 patch topically every 12 (twelve) hours as needed (pain).   lisinopril (ZESTRIL) 40 MG tablet Take 40 mg by mouth daily.   melatonin 3 MG TABS tablet Take 6 mg by mouth at bedtime as needed (sleep).   pantoprazole (PROTONIX) 40 MG tablet Take 1 tablet (40 mg total) by mouth daily.   potassium chloride SA (KLOR-CON) 20 MEQ tablet Take 1 tablet (20 mEq total) by mouth every other day.   tiZANidine (ZANAFLEX) 4  MG tablet Take 2 mg by mouth daily as needed for muscle spasms (back spasms).   [DISCONTINUED] furosemide (LASIX) 40 MG tablet Take 1 tablet (40 mg total) by mouth every other day.     Allergies:   Patient has no known allergies.   Social History   Socioeconomic History   Marital status: Widowed    Spouse name: Not on file   Number of children: Not on file   Years of education: Not on file   Highest education level: Not on file  Occupational History   Not on file  Tobacco Use   Smoking status: Never   Smokeless tobacco: Never  Vaping Use   Vaping Use: Never used  Substance and Sexual Activity   Alcohol use: No   Drug use: No   Sexual activity: Not on file  Other Topics Concern   Not on file  Social History Narrative   Not on file   Social Determinants  of Health   Financial Resource Strain: Not on file  Food Insecurity: Not on file  Transportation Needs: Not on file  Physical Activity: Not on file  Stress: Not on file  Social Connections: Not on file     Family History: The patient's family history includes Brain cancer in his father. ROS:   Please see the history of present illness.    All other systems reviewed and are negative.  EKGs/Labs/Other Studies Reviewed:    The following studies were reviewed today:    Recent Labs: 09/30/2020: ALT 16; Hemoglobin 13.1; Platelets 199 11/16/2020: BUN 15; Creatinine, Ser 1.12; Magnesium 2.0; Potassium 4.6; Sodium 138  Recent Lipid Panel    Component Value Date/Time   CHOL 147 09/30/2020 0405   CHOL 164 04/07/2020 1048   TRIG 126 09/30/2020 0405   HDL 29 (L) 09/30/2020 0405   HDL 39 (L) 04/07/2020 1048   CHOLHDL 5.1 09/30/2020 0405   VLDL 25 09/30/2020 0405   LDLCALC 93 09/30/2020 0405   LDLCALC 96 04/07/2020 1048    Physical Exam:    VS:  BP (!) 158/88 (BP Location: Right Arm, Patient Position: Sitting, Cuff Size: Normal)   Pulse 98   Ht 5\' 7"  (1.702 m)   Wt 199 lb 6.4 oz (90.4 kg)   SpO2 95%   BMI 31.23  kg/m     Wt Readings from Last 3 Encounters:  01/14/21 199 lb 6.4 oz (90.4 kg)  11/16/20 195 lb 12.8 oz (88.8 kg)  11/03/20 192 lb (87.1 kg)     GEN: He appears his age well nourished, well developed in no acute distress HEENT: Normal NECK: No JVD; No carotid bruits LYMPHATICS: No lymphadenopathy CARDIAC: RRR, no murmurs, rubs, gallops RESPIRATORY:  Clear to auscultation without rales, wheezing or rhonchi  ABDOMEN: Soft, non-tender, non-distended MUSCULOSKELETAL:  No edema; No deformity  SKIN: Warm and dry NEUROLOGIC:  Alert and oriented x 3 PSYCHIATRIC:  Normal affect    Signed, Erik More, MD  01/14/2021 10:49 AM    [place]

## 2021-01-14 ENCOUNTER — Ambulatory Visit (INDEPENDENT_AMBULATORY_CARE_PROVIDER_SITE_OTHER): Payer: Medicare Other | Admitting: Cardiology

## 2021-01-14 ENCOUNTER — Encounter: Payer: Self-pay | Admitting: Cardiology

## 2021-01-14 ENCOUNTER — Other Ambulatory Visit: Payer: Self-pay

## 2021-01-14 VITALS — BP 158/88 | HR 98 | Ht 67.0 in | Wt 199.4 lb

## 2021-01-14 DIAGNOSIS — I2510 Atherosclerotic heart disease of native coronary artery without angina pectoris: Secondary | ICD-10-CM

## 2021-01-14 DIAGNOSIS — E782 Mixed hyperlipidemia: Secondary | ICD-10-CM | POA: Diagnosis not present

## 2021-01-14 DIAGNOSIS — I110 Hypertensive heart disease with heart failure: Secondary | ICD-10-CM

## 2021-01-14 DIAGNOSIS — Z950 Presence of cardiac pacemaker: Secondary | ICD-10-CM

## 2021-01-14 MED ORDER — FUROSEMIDE 40 MG PO TABS: 40.0000 mg | ORAL_TABLET | Freq: Every day | ORAL | 3 refills | Status: DC

## 2021-01-14 NOTE — Patient Instructions (Signed)
Medication Instructions:  Your physician has recommended you make the following change in your medication:  INCREASE: Furosemide 40 mg by mouth daily.  *If you need a refill on your cardiac medications before your next appointment, please call your pharmacy*   Lab Work: None If you have labs (blood work) drawn today and your tests are completely normal, you will receive your results only by: [place] (if you have MyChart) OR A paper copy in the mail If you have any lab test that is abnormal or we need to change your treatment, we will call you to review the results.   Testing/Procedures: None   Follow-Up: At [facility], you and your health needs are our priority.  As part of our continuing mission to provide you with exceptional heart care, we have created designated Provider Care Teams.  These Care Teams include your primary Cardiologist (physician) and Advanced Practice Providers (APPs -  Physician Assistants and Nurse Practitioners) who all work together to provide you with the care you need, when you need it.  We recommend signing up for the patient portal called "MyChart".  Sign up information is provided on this After Visit Summary.  MyChart is used to connect with patients for Virtual Visits (Telemedicine).  Patients are able to view lab/test results, encounter notes, upcoming appointments, etc.  Non-urgent messages can be sent to your provider as well.   To learn more about what you can do with MyChart, go to [email or web address].    Your next appointment:   6 month(s)  The format for your next appointment:   In Person  Provider:   Shirlee More, MD   Other Instructions

## 2021-02-04 ENCOUNTER — Other Ambulatory Visit: Payer: Self-pay

## 2021-02-04 ENCOUNTER — Ambulatory Visit (INDEPENDENT_AMBULATORY_CARE_PROVIDER_SITE_OTHER): Payer: Medicare Other | Admitting: Podiatry

## 2021-02-04 ENCOUNTER — Telehealth: Payer: Self-pay

## 2021-02-04 ENCOUNTER — Encounter: Payer: Self-pay | Admitting: Podiatry

## 2021-02-04 DIAGNOSIS — M79675 Pain in left toe(s): Secondary | ICD-10-CM

## 2021-02-04 DIAGNOSIS — M79674 Pain in right toe(s): Secondary | ICD-10-CM

## 2021-02-04 DIAGNOSIS — B351 Tinea unguium: Secondary | ICD-10-CM

## 2021-02-04 NOTE — Telephone Encounter (Signed)
Received anticoagulation Clearance Authorization from [place] for Spinal Cord Stimulation Trial.  Per Janett Billow, NP  Ok to Hold Plavix 75 mg 7 days prior with small but acceptable risk of recurrent stroke while off therapy. Patient to resume medication immediately after of once hemodynamically stable.   Fax has been sent to # [phone], confirmation received.

## 2021-02-07 NOTE — Progress Notes (Signed)
Subjective: Erik Brock. is a pleasant 87 y.o. male patient seen today with h/o peripheral neuropathy. He has painful thick toenails that are difficult to trim. Pain interferes with ambulation. Aggravating factors include wearing enclosed shoe gear. Pain is relieved with periodic professional debridement.  He is a resident at [facility]. He is recovering from a stroke he had a couple of months ago and states he is feeling much better. He voices no new pedal problems on today's visit.  PCP is Mateo Flow, MD.  No Known Allergies  Objective: Physical Exam  General: Erik Brock. is a pleasant 87 y.o. Caucasian male, in NAD. AAO x 3.   Vascular:  Capillary fill time to digits <3 seconds b/l lower extremities. Faintly palpable DP pulse(s) b/l lower extremities. Nonpalpable PT pulse(s) b/l lower extremities. Pedal hair sparse. Lower extremity skin temperature gradient within normal limits. No pain with calf compression b/l. Varicosities present b/l.  Dermatological:  Pedal skin with normal turgor, texture and tone b/l lower extremities. No open wounds b/l lower extremities. No interdigital macerations b/l lower extremities. Toenails 1-5 b/l elongated, discolored, dystrophic, thickened, crumbly with subungual debris and tenderness to dorsal palpation. Left 3rd toenail elongated and impinging on the skin of the left 2nd toe. There is an indentation, but no breaks in skin nor signs of infection.  Musculoskeletal:  Normal muscle strength 5/5 to all lower extremity muscle groups bilaterally. No pain crepitus or joint limitation noted with ROM b/l. Utilizes rollator for ambulation assistance.  Neurological:  Pt has subjective symptoms of neuropathy. Protective sensation intact 5/5 intact bilaterally with 10g monofilament b/l.  Assessment and Plan:  1. Pain due to onychomycosis of toenails of both feet     -Patient to continue soft, supportive shoe gear  daily. -Toenails 1-5 b/l were debrided in length and girth with sterile nail nippers and dremel without iatrogenic bleeding.  -Patient to report any pedal injuries to medical professional immediately. -Patient/POA to call should there be question/concern in the interim.  Return in about 3 months (around 05/07/2021).  Marzetta Board, DPM

## 2021-02-08 NOTE — Progress Notes (Signed)
[facility] [address] [place]. [place] [address] [phone]       STROKE FOLLOW UP NOTE  [name] Brock. Date of Birth:  26-Mar-1928 Medical Record Number:  [id number]   Reason for Referral: stroke follow up    SUBJECTIVE:   CHIEF COMPLAINT:  Chief Complaint  Patient presents with   Follow-up    RM 3 with son  Pt is well and stable, no new complication      HPI:   Today, 02/09/2021, [name] Brock returns for 49-month stroke follow-up accompanied by his son, Erik Brock Brock.  Stable since prior visit without new stroke/TIA symptoms and reports residual imbalance with LLE weakness and short term memory but improvement since prior visit. Continues to work with therapies but plans on completing around beginning of August.  At prior visit, he was nonambulatory but was able to stand with PT.  He has now ambulating with Rollator walker and denies any recent falls.  He does have chronic gait impairment with imbalance previously using Rollator walker but imbalance still slightly worse compared to baseline per son.  He does have chronic history of lumbar and cervical spinal disease and routinely follows with pain clinic in [facility]  Reports compliance on Plavix and atorvastatin without associated side effects.  Blood pressure today 110/65 - per son, typically higher at baseline -rechecked x3 with consistent measurements.  Per son, routinely checked at facility and routinely follows with cardiology for monitoring and management.  No new concerns at this time.    History provided for reference purposes Initial visit 11/03/2020 Erik Brock [name] Brock is being seen for hospital follow-up accompanied by his son, Erik Brock Brock  Reports he has been doing well since discharge with residual LLE distal weakness with continued recovery.  He has since returned back to ALF last week and currently working with [facility] PT/OT.  He has not yet begun walking but is able to stand with PT assistance.  Using Rollator  walker PTA.  Denies new stroke/TIA symptoms.  Completed 3 weeks DAPT and remains on aspirin alone without associated side effects (per d/c recommendations) Compliant on atorvastatin 40 mg daily without associated side effects Blood pressure today 159/88 -occasionally monitored at ALF and has been fluctuating anywhere from SBP 140s-170s.  He has not yet had follow-up with PCP or cardiology since discharge  No further concerns at this time  Stroke admission 10/01/2018 Erik Brock Brock is a 87 y.o. male with a medical history significant for hypertension, coronary artery disease on home aspirin 81 mg daily, chronic diastolic heart failure, sick sinus syndrome s/p permanent pacemaker placement, hypothyroidism, hyperlipidemia, cardiomyopathy, history of lacunar strokes, and degenerative disc disease who presented to [facility] 09/30/2020 as a transfer from [facility] for evaluation of suspected ischemic stroke requiring MRI evaluation with PPM.  Personally reviewed hospitalization pertinent progress notes, lab work and imaging with summary provided.  Evaluated by Erik Brock. Leonie Brock.  He resides in a assisted living facility where he noticed left lower extremity weakness upon awakening with imbalance and gait impairment.  Stroke work-up revealed small acute right frontal parietal white matter infarct likely secondary to small vessel disease.  Recommended DAPT for 3 weeks and Plavix alone.  HTN stable.  LDL 93 and initiate atorvastatin 40 mg daily on simvastatin 20 mg daily PTA.  A1c 6.1.  Other stroke risk factors include advanced age, CAD, CHF and prior stroke history.  Evaluated by therapies who recommended discharge to SNF for ongoing therapy needs.    Stroke -  small acute right frontal parietal white matter infarct likely secondary to small vessel disease CTA head atrophy with stable supratentorial small vessel disease; no acute infarct appreciated CTA head negative LVO; severe short segment proximal left P2  stenosis with downstream distal left P3 occlusion; moderate atheromatous irregularity throughout the carotid siphons with associated mild to moderate multifocal narrowing MRI  small acute right frontal parietal white matter infarct; moderate chronic small vessel ischemic disease Carotid duplex b/l ICA 1 to 39% stenosis, VAs antegrade 2D Echo EF 60 to 65% with grade 1 diastolic dysfunction LDL 93 HgbA1c  6.1 Antithrombotic regimen: On aspirin 81 mg PTA. Now on aspirin Plavix for 3 weeks and Plavix alone Therapy recommendations: SNF Disposition:  SNF     ROS:   14 system review of systems performed and negative with exception of those listed in HPI  PMH:  Past Medical History:  Diagnosis Date   Ascending aortic aneurysm ([place]) 09/05/2017   AV block, Mobitz II 12/08/2014   Bleeding nose    CAD (coronary artery disease) 12/08/2014   Overview:  Mild, nonobsructive  Overview:  Mild, nonobsructive   Cardiac pacemaker in situ 12/22/2014   Cardiomyopathy ([place]) 09/05/2017   Chronic diastolic heart failure ([place]) 04/05/2015   Dyslipidemia 04/05/2015   Elevated PSA 07/30/2013   Essential (primary) hypertension 07/30/2013   GERD (gastroesophageal reflux disease) 07/30/2013   Gonalgia 11/11/2014   Hyperlipidemia 07/30/2013   Hypertension    Hypertensive heart disease with heart failure ([place]) 04/05/2015   LBBB (left bundle branch block) 12/08/2014   Lumbar stenosis 07/30/2013   Neuritis or radiculitis due to rupture of lumbar intervertebral disc 11/11/2014   Pacemaker reprogramming/check 12/22/2014   Thyroid disease    Trigger point of right shoulder region 10/14/2014    PSH:  Past Surgical History:  Procedure Laterality Date   APPENDECTOMY     INSERT / REPLACE / REMOVE PACEMAKER     tonsills      Social History:  Social History   Socioeconomic History   Marital status: Widowed    Spouse name: Not on file   Number of children: Not on file   Years of education: Not on file   Highest education  level: Not on file  Occupational History   Not on file  Tobacco Use   Smoking status: Never   Smokeless tobacco: Never  Vaping Use   Vaping Use: Never used  Substance and Sexual Activity   Alcohol use: No   Drug use: No   Sexual activity: Not on file  Other Topics Concern   Not on file  Social History Narrative   Not on file   Social Determinants of Health   Financial Resource Strain: Not on file  Food Insecurity: Not on file  Transportation Needs: Not on file  Physical Activity: Not on file  Stress: Not on file  Social Connections: Not on file  Intimate Partner Violence: Not on file    Family History:  Family History  Problem Relation Age of Onset   Brain cancer Father     Medications:   Current Outpatient Medications on File Prior to Visit  Medication Sig Dispense Refill   alendronate (FOSAMAX) 70 MG tablet Take 70 mg by mouth once a week. Tuesday     atorvastatin (LIPITOR) 40 MG tablet Take 1 tablet (40 mg total) by mouth daily. 30 tablet 11   clopidogrel (PLAVIX) 75 MG tablet Take 1 tablet (75 mg total) by mouth daily. 30 tablet 11  finasteride (PROSCAR) 5 MG tablet Take 5 mg by mouth daily.     furosemide (LASIX) 40 MG tablet Take 1 tablet (40 mg total) by mouth daily. 90 tablet 3   gabapentin (NEURONTIN) 300 MG capsule Take 300 mg by mouth at bedtime.     levothyroxine (SYNTHROID, LEVOTHROID) 50 MCG tablet Take 50 mcg by mouth daily before breakfast.     Lidocaine 4 % PTCH Apply 1 patch topically every 12 (twelve) hours as needed (pain).     lisinopril (ZESTRIL) 40 MG tablet Take 40 mg by mouth daily.     melatonin 3 MG TABS tablet Take 6 mg by mouth at bedtime as needed (sleep).     pantoprazole (PROTONIX) 40 MG tablet Take 40 mg by mouth daily.     potassium chloride SA (KLOR-CON) 20 MEQ tablet Take 20 mEq by mouth 2 (two) times daily.     tiZANidine (ZANAFLEX) 4 MG tablet Take 2 mg by mouth daily as needed for muscle spasms (back spasms).     No current  facility-administered medications on file prior to visit.    Allergies:  No Known Allergies    OBJECTIVE:  Physical Exam  Vitals:   02/09/21 1004  BP: 110/65  Pulse: 73  Weight: 200 lb (90.7 kg)  Height: 5\' 7"  (1.702 m)    Body mass index is 31.32 kg/m. No results found.  General: well developed, well nourished,  very pleasant elderly Caucasian male, seated, in no evident distress Head: head normocephalic and atraumatic.   Neck: supple with no carotid or supraclavicular bruits Cardiovascular: regular rate and rhythm, no murmurs Musculoskeletal: no deformity Skin:  no rash/petichiae; BLE edema Vascular:  Normal pulses all extremities   Neurologic Exam Mental Status: Awake and fully alert.   Fluent speech and language.  Oriented to place and time. Recent and remote memory intact. Attention span, concentration and fund of knowledge appropriate. Mood and affect appropriate.  Cranial Nerves: Pupils equal, briskly reactive to light. Extraocular movements full without nystagmus. Visual fields full to confrontation. Hearing intact. Facial sensation intact. Face, tongue, palate moves normally and symmetrically.  Motor: Normal bulk and tone. Normal strength in all tested extremity muscles except bilateral foot drop (possibly chronic per son) Sensory.: intact to touch , pinprick , position and vibratory sensation.  Coordination: Rapid alternating movements normal in all extremities. Finger-to-nose and heel-to-shin performed accurately bilaterally. Gait and Station: Stands from seated position without difficulty.  Stance is slightly hunched.  Gait demonstrates decreased stride length and step height with foot slapping and occasional scuffing of toes bilaterally Reflexes: 1+ and symmetric. Toes downgoing.      ASSESSMENT: Erik Brock Brock. is a 87 y.o. year old male presented with LLE weakness on 09/29/2020 with stroke work-up revealing small acute right frontal parietal white matter  infarct likely secondary to small vessel disease. Vascular risk factors include HTN, HLD, CAD, CHF, SSS s/p pacer, cardiomyopathy, prior strokes on imaging and age.      PLAN:  R frontoparietal WM stroke:  Residual deficit: LLE distal weakness, gait impairment and short-term difficulties.  Continue participation with therapies and may possibly benefit from additional outpatient therapies once completed.  Discussed possible spinal and lumbar concerns possible contributing factor of weakness and gait impairment -he continues to follow with pain management Continue clopidogrel 75 mg daily and increase atorvastatin from 40 mg to 80 mg daily for secondary stroke prevention Discussed secondary stroke prevention measures and importance of close PCP follow up for aggressive  stroke risk factor management  HTN: BP goal <130/90.  Elevated today on current regimen -advised follow-up with PCP and cardiology HLD: LDL goal <70. Recent LDL 97 (01/21/2021) up from 93 - on atorvastatin 40 mg daily therefore recommend increasing to 80 mg daily - paper rx printed for son to bring to [place].  Lower extremity edema: discussed concern for R>L lower extremity swelling.  Since evaluated cardiology and started on furosemide    Follow up in 6 months or call earlier if needed   CC:  [place] provider: Dr. Leonie Brock PCP: Erik Brock Flow, MD     I spent 35 minutes of face-to-face and non-face-to-face time with patient and son.  This included previsit chart review, lab review, study review, order entry, electronic health record documentation, patient education regarding prior stroke and etiology, secondary stroke prevention measures and importance of managing stroke risk factors, residual deficits and possible further recovery and answered all other questions to patient and sons satisfaction  Frann Rider, AGNP-BC  [facility] [address] [place], [place] [address]  Phone [phone] Fax  [phone] Note: This document was prepared with digital dictation and possible smart phrase technology. Any transcriptional errors that result from this process are unintentional.

## 2021-02-09 ENCOUNTER — Ambulatory Visit (INDEPENDENT_AMBULATORY_CARE_PROVIDER_SITE_OTHER): Payer: Medicare Other | Admitting: Adult Health

## 2021-02-09 ENCOUNTER — Encounter: Payer: Self-pay | Admitting: Adult Health

## 2021-02-09 VITALS — BP 110/65 | HR 73 | Ht 67.0 in | Wt 200.0 lb

## 2021-02-09 DIAGNOSIS — I69398 Other sequelae of cerebral infarction: Secondary | ICD-10-CM

## 2021-02-09 DIAGNOSIS — I10 Essential (primary) hypertension: Secondary | ICD-10-CM | POA: Diagnosis not present

## 2021-02-09 DIAGNOSIS — R29898 Other symptoms and signs involving the musculoskeletal system: Secondary | ICD-10-CM

## 2021-02-09 DIAGNOSIS — R269 Unspecified abnormalities of gait and mobility: Secondary | ICD-10-CM

## 2021-02-09 DIAGNOSIS — I639 Cerebral infarction, unspecified: Secondary | ICD-10-CM

## 2021-02-09 DIAGNOSIS — E785 Hyperlipidemia, unspecified: Secondary | ICD-10-CM

## 2021-02-09 MED ORDER — ATORVASTATIN CALCIUM 80 MG PO TABS: 80.0000 mg | ORAL_TABLET | Freq: Every day | ORAL | 11 refills | Status: AC

## 2021-02-09 NOTE — Progress Notes (Signed)
I agree with the above plan 

## 2021-02-09 NOTE — Patient Instructions (Signed)
Continue clopidogrel 75 mg daily  and increase atorvastatin to 80mg  daily for secondary stroke prevention  Continue to follow up with PCP/cardiology regarding cholesterol and blood pressure management  Maintain strict control of hypertension with blood pressure goal below 130/90 and cholesterol with LDL cholesterol (bad cholesterol) goal below 70 mg/dL.   Continue working with therapies for hopeful ongoing improvement. Your lower back and neck issues may be contributing to your continued gait difficulties and leg weakness - if symptoms continue, you may want to be seen by neurosurgery for further evaluation     Followup in the future with me in 6 months or call earlier if needed       Thank you for coming to see [place] at [facility]. I hope we have been able to provide you high quality care today.  You may receive a patient satisfaction survey over the next few weeks. We would appreciate your feedback and comments so that we may continue to improve ourselves and the health of our patients.

## 2021-04-29 DIAGNOSIS — I351 Nonrheumatic aortic (valve) insufficiency: Secondary | ICD-10-CM | POA: Diagnosis not present

## 2021-04-30 DIAGNOSIS — I6389 Other cerebral infarction: Secondary | ICD-10-CM

## 2021-05-20 ENCOUNTER — Other Ambulatory Visit: Payer: Self-pay

## 2021-05-20 ENCOUNTER — Encounter: Payer: Self-pay | Admitting: Podiatry

## 2021-05-20 ENCOUNTER — Ambulatory Visit (INDEPENDENT_AMBULATORY_CARE_PROVIDER_SITE_OTHER): Payer: Medicare Other | Admitting: Podiatry

## 2021-05-20 DIAGNOSIS — M79674 Pain in right toe(s): Secondary | ICD-10-CM

## 2021-05-20 DIAGNOSIS — B351 Tinea unguium: Secondary | ICD-10-CM | POA: Diagnosis not present

## 2021-05-20 DIAGNOSIS — M79675 Pain in left toe(s): Secondary | ICD-10-CM

## 2021-05-20 NOTE — Patient Instructions (Signed)
To Nursing:  Apply triple antibiotic ointment to right 3rd toe once daily for one week, then stop.

## 2021-05-23 NOTE — Progress Notes (Signed)
  Subjective:  Patient ID: Erik Mu., male    DOB: [birth year],  MRN: [id number]  Erik Mu. presents to clinic today for painful thick toenails that are difficult to trim. Pain interferes with ambulation. Aggravating factors include wearing enclosed shoe gear. Pain is relieved with periodic professional debridement.  He voices no new pedal problems on today's visit.  He still resides at [facility].   No Known Allergies  Review of Systems: Negative except as noted in the HPI. Objective:   Constitutional Erik Aja. is a pleasant 87 y.o. Caucasian male, in NAD. AAO x 3.   Vascular CFT <3 seconds b/l LE. Faintly palpable DP pulse(s) b/l lower extremities. Nonpalpable PT pulse(s) b/l lower extremities. Pedal hair sparse. Lower extremity skin temperature gradient within normal limits. No edema noted b/l LE. Varicosities present b/l. No cyanosis or clubbing noted.  Neurologic Normal speech. Oriented to person, place, and time. Pt has subjective symptoms of neuropathy. Protective sensation intact 5/5 intact bilaterally with 10g monofilament b/l. Proprioception intact bilaterally.  Dermatologic Pedal skin with normal turgor, texture and tone b/l lower extremities. No open wounds b/l LE. No interdigital macerations noted b/l LE. Toenails 1-5 b/l elongated, discolored, dystrophic, thickened, crumbly with subungual debris and tenderness to dorsal palpation.  Orthopedic: Normal muscle strength 5/5 to all lower extremity muscle groups bilaterally. No gross bony deformities b/l lower extremities. Utilizes rollator for ambulation assistance.   Radiographs: None Assessment:   1. Pain due to onychomycosis of toenails of both feet    Plan:  Patient was evaluated and treated and all questions answered. Consent given for treatment as described below: -Examined patient. -Toenails 1-5 bilaterally were debrided in length and girth with sterile nail nippers and  dremel. Pinpoint bleeding of R 3rd toe addressed with Lumicain Hemostatic Solution, cleansed with alcohol. triple antibiotic ointment applied. Order written for Nursing to apply triple antibiotic ointment once daily for 7 days. -Patient/POA to call should there be question/concern in the interim.  Return in about 3 months (around 08/20/2021).  Marzetta Board, DPM

## 2021-07-25 NOTE — Progress Notes (Signed)
Cardiology Office Note:    Date:  07/26/2021   ID:  Erik Mu., DOB February 09, 1928, MRN [id number]  PCP:  Erik Flow, MD  Cardiologist:  Erik More, MD    Referring MD: Erik Flow, MD    ASSESSMENT:    1. Hypertensive heart disease with heart failure ([place])   2. AV block, Mobitz II   3. Cardiac pacemaker in situ   4. Coronary artery disease involving native coronary artery of native heart without angina pectoris   5. Mixed hyperlipidemia   6. History of CVA (cerebrovascular accident)    PLAN:    In order of problems listed above:  Erik Brock continues to do well his heart failure is compensated he will continue his current loop diuretic and antihypertensives with lisinopril. Stable bradycardia pacemaker followed by device clinic [place] Heart Association class I having no angina continue medical therapy including clopidogrel lipid-lowering with his high intensity statin Continue his high intensity statin Stable continue medical therapy and clopidogrel.   Next appointment: 6 months   Medication Adjustments/Labs and Tests Ordered: Current medicines are reviewed at length with the patient today.  Concerns regarding medicines are outlined above.  No orders of the defined types were placed in this encounter.  No orders of the defined types were placed in this encounter.   Follow-up heart failure CAD   History of Present Illness:    Erik Steuber. is a 88 y.o. male with a hx of hypertensive heart disease with chronic diastolic heart failure coronary artery disease sick sinus syndrome permanent dual-chamber pacemaker and right middle cerebral artery stroke last seen 01/14/2021.  Compliance with diet, lifestyle and medications: Yes  There is always concern is here with him participates in evaluation decision making His father has bounced back after his hospitalization and I think in retrospect when he had was delirium associated with facial cellulitis He is  not having edema shortness of breath chest pain palpitation or syncope and is up-to-date with his device follow-up in our clinic  An echocardiogram [facility] 04/30/2021 with mildly reduced EF 45 to 50% and mild aortic regurgitation His last pacemaker download 11/11/2020 showed normal lead and device function he is ventricularly paced about 2% of the time and atrially paced 42% of the time.  Recent labs 05/01/2021: Mildly anemic hemoglobin 11.6 creatinine 1.40 platelets 294,000 sodium 139 potassium 3.9 proBNP level was 64 CT of the head performed 04/30/2021 showed no acute intracranial process CT angiography head neck showed no significant large vessel occlusion or stenosis he has EKG in hospital showing to be atrially paced and ventricularly paced 100% of the time chest x-ray showed no acute process Past Medical History:  Diagnosis Date   Ascending aortic aneurysm 09/05/2017   AV block, Mobitz II 12/08/2014   Bleeding nose    CAD (coronary artery disease) 12/08/2014   Overview:  Mild, nonobsructive  Overview:  Mild, nonobsructive   Cardiac pacemaker in situ 12/22/2014   Cardiomyopathy ([place]) 09/05/2017   Chronic diastolic heart failure ([place]) 04/05/2015   Dyslipidemia 04/05/2015   Elevated PSA 07/30/2013   Essential (primary) hypertension 07/30/2013   GERD (gastroesophageal reflux disease) 07/30/2013   Gonalgia 11/11/2014   Hyperlipidemia 07/30/2013   Hypertension    Hypertensive heart disease with heart failure ([place]) 04/05/2015   LBBB (left bundle branch block) 12/08/2014   Lumbar stenosis 07/30/2013   Neuritis or radiculitis due to rupture of lumbar intervertebral disc 11/11/2014   Pacemaker reprogramming/check 12/22/2014   Thyroid  disease    Trigger point of right shoulder region 10/14/2014    Past Surgical History:  Procedure Laterality Date   APPENDECTOMY     INSERT / REPLACE / REMOVE PACEMAKER     tonsills      Current Medications: Current Meds  Medication Sig   alendronate (FOSAMAX)  70 MG tablet Take 70 mg by mouth once a week. Tuesday   alum & mag hydroxide-simeth (MYLANTA) 200-200-20 MG/5ML suspension Take 30 mLs by mouth every 4 (four) hours as needed for indigestion or heartburn.   atorvastatin (LIPITOR) 80 MG tablet Take 1 tablet (80 mg total) by mouth daily.   chlorhexidine (PERIDEX) 0.12 % solution 15 mLs daily. Apply to gums daily   cloNIDine (CATAPRES) 0.1 MG tablet Take 0.1 mg by mouth 2 (two) times daily as needed (SBP > 170).   clopidogrel (PLAVIX) 75 MG tablet Take 1 tablet (75 mg total) by mouth daily.   finasteride (PROSCAR) 5 MG tablet Take 5 mg by mouth daily.   furosemide (LASIX) 20 MG tablet Take 20 mg by mouth daily as needed for edema (Weight gain > 3lbs in 24 hrs).   furosemide (LASIX) 40 MG tablet Take 1 tablet (40 mg total) by mouth daily. (Patient taking differently: Take 40 mg by mouth 2 (two) times daily.)   gabapentin (NEURONTIN) 300 MG capsule Take 300 mg by mouth at bedtime.   guaiFENesin (ROBITUSSIN) 100 MG/5ML liquid Take 5 mLs by mouth every 4 (four) hours as needed for cough or to loosen phlegm.   levothyroxine (SYNTHROID) 75 MCG tablet Take 75 mcg by mouth daily.   Lidocaine 4 % PTCH Apply 1 patch topically every 12 (twelve) hours as needed (pain).   lisinopril (ZESTRIL) 40 MG tablet Take 40 mg by mouth daily.   melatonin 3 MG TABS tablet Take 6 mg by mouth at bedtime as needed (sleep).   omeprazole (PRILOSEC) 20 MG capsule Take 20 mg by mouth daily.   potassium chloride SA (KLOR-CON M) 20 MEQ tablet Take 20 mEq by mouth every other day.   tamsulosin (FLOMAX) 0.4 MG CAPS capsule Take 0.4 mg by mouth at bedtime.   tiZANidine (ZANAFLEX) 4 MG tablet Take 2 mg by mouth daily as needed for muscle spasms (back spasms).   vitamin B-12 (CYANOCOBALAMIN) 500 MCG tablet Take 1 tablet by mouth daily.     Allergies:   Patient has no known allergies.   Social History   Socioeconomic History   Marital status: Widowed    Spouse name: Not on file    Number of children: Not on file   Years of education: Not on file   Highest education level: Not on file  Occupational History   Not on file  Tobacco Use   Smoking status: Never    Passive exposure: Never   Smokeless tobacco: Never  Vaping Use   Vaping Use: Never used  Substance and Sexual Activity   Alcohol use: No   Drug use: No   Sexual activity: Not on file  Other Topics Concern   Not on file  Social History Narrative   Not on file   Social Determinants of Health   Financial Resource Strain: Not on file  Food Insecurity: Not on file  Transportation Needs: Not on file  Physical Activity: Not on file  Stress: Not on file  Social Connections: Not on file     Family History: The patient's family history includes Brain cancer in his father. ROS:   Please  see the history of present illness.    All other systems reviewed and are negative.  EKGs/Labs/Other Studies Reviewed:    The following studies were reviewed today:   Recent Labs: 09/30/2020: ALT 16; Hemoglobin 13.1; Platelets 199 11/16/2020: BUN 15; Creatinine, Ser 1.12; Magnesium 2.0; Potassium 4.6; Sodium 138  Recent Lipid Panel    Component Value Date/Time   CHOL 147 09/30/2020 0405   CHOL 164 04/07/2020 1048   TRIG 126 09/30/2020 0405   HDL 29 (L) 09/30/2020 0405   HDL 39 (L) 04/07/2020 1048   CHOLHDL 5.1 09/30/2020 0405   VLDL 25 09/30/2020 0405   LDLCALC 93 09/30/2020 0405   LDLCALC 96 04/07/2020 1048    Physical Exam:    VS:  BP 120/68 (BP Location: Right Arm)    Pulse 78    Ht 5\' 7"  (1.702 m)    Wt 215 lb (97.5 kg)    SpO2 98%    BMI 33.67 kg/m     Wt Readings from Last 3 Encounters:  07/26/21 215 lb (97.5 kg)  02/09/21 200 lb (90.7 kg)  01/14/21 199 lb 6.4 oz (90.4 kg)     GEN: Appears his age well nourished, well developed in no acute distress HEENT: Normal NECK: No JVD; No carotid bruits LYMPHATICS: No lymphadenopathy CARDIAC: RRR, no murmurs, rubs, gallops he has a grade 1/6 to 2/6  localized murmur in aortic area of aortic stenosis RESPIRATORY:  Clear to auscultation without rales, wheezing or rhonchi  ABDOMEN: Soft, non-tender, non-distended MUSCULOSKELETAL:  No edema; No deformity  SKIN: Warm and dry NEUROLOGIC:  Alert and oriented x 3 PSYCHIATRIC:  Normal affect    Signed, Erik More, MD  07/26/2021 1:41 PM    [facility]

## 2021-07-26 ENCOUNTER — Other Ambulatory Visit: Payer: Self-pay

## 2021-07-26 ENCOUNTER — Ambulatory Visit (INDEPENDENT_AMBULATORY_CARE_PROVIDER_SITE_OTHER): Payer: Medicare Other | Admitting: Cardiology

## 2021-07-26 ENCOUNTER — Encounter: Payer: Self-pay | Admitting: Cardiology

## 2021-07-26 VITALS — BP 120/68 | HR 78 | Ht 67.0 in | Wt 215.0 lb

## 2021-07-26 DIAGNOSIS — I110 Hypertensive heart disease with heart failure: Secondary | ICD-10-CM | POA: Diagnosis not present

## 2021-07-26 DIAGNOSIS — E782 Mixed hyperlipidemia: Secondary | ICD-10-CM

## 2021-07-26 DIAGNOSIS — Z950 Presence of cardiac pacemaker: Secondary | ICD-10-CM

## 2021-07-26 DIAGNOSIS — I2510 Atherosclerotic heart disease of native coronary artery without angina pectoris: Secondary | ICD-10-CM

## 2021-07-26 DIAGNOSIS — Z8673 Personal history of transient ischemic attack (TIA), and cerebral infarction without residual deficits: Secondary | ICD-10-CM

## 2021-07-26 DIAGNOSIS — I441 Atrioventricular block, second degree: Secondary | ICD-10-CM | POA: Diagnosis not present

## 2021-07-26 NOTE — Patient Instructions (Signed)
Medication Instructions:  Your physician recommends that you continue on your current medications as directed. Please refer to the Current Medication list given to you today.  *If you need a refill on your cardiac medications before your next appointment, please call your pharmacy*   Lab Work: None If you have labs (blood work) drawn today and your tests are completely normal, you will receive your results only by: [place] (if you have MyChart) OR A paper copy in the mail If you have any lab test that is abnormal or we need to change your treatment, we will call you to review the results.   Testing/Procedures: None    Follow-Up: At [facility], you and your health needs are our priority.  As part of our continuing mission to provide you with exceptional heart care, we have created designated Provider Care Teams.  These Care Teams include your primary Cardiologist (physician) and Advanced Practice Providers (APPs -  Physician Assistants and Nurse Practitioners) who all work together to provide you with the care you need, when you need it.  We recommend signing up for the patient portal called "MyChart".  Sign up information is provided on this After Visit Summary.  MyChart is used to connect with patients for Virtual Visits (Telemedicine).  Patients are able to view lab/test results, encounter notes, upcoming appointments, etc.  Non-urgent messages can be sent to your provider as well.   To learn more about what you can do with MyChart, go to [email or web address].    Your next appointment:   6 month(s)  The format for your next appointment:   In Person  Provider:   Shirlee More, MD    Other Instructions None

## 2021-08-12 ENCOUNTER — Ambulatory Visit: Payer: Medicare Other | Admitting: Adult Health

## 2021-09-09 ENCOUNTER — Ambulatory Visit (INDEPENDENT_AMBULATORY_CARE_PROVIDER_SITE_OTHER): Payer: Medicare Other | Admitting: Podiatry

## 2021-09-09 ENCOUNTER — Encounter: Payer: Self-pay | Admitting: Podiatry

## 2021-09-09 DIAGNOSIS — B351 Tinea unguium: Secondary | ICD-10-CM | POA: Diagnosis not present

## 2021-09-09 DIAGNOSIS — M79675 Pain in left toe(s): Secondary | ICD-10-CM

## 2021-09-09 DIAGNOSIS — M79674 Pain in right toe(s): Secondary | ICD-10-CM | POA: Diagnosis not present

## 2021-09-14 NOTE — Progress Notes (Signed)
°  Subjective:  Patient ID: Erik Brock., male    DOB: 06/15/1928,  MRN: [id number]  88 y.o. male presents with painful elongated mycotic toenails 1-5 bilaterally which are tender when wearing enclosed shoe gear. Pain is relieved with periodic professional debridement..    New problem(s): None    PCP: Mateo Flow, MD and last visit was: January, 2023.  Review of Systems: Negative except as noted in the HPI.   No Known Allergies  Objective:  There were no vitals filed for this visit. Constitutional Patient is a pleasant 88 y.o. Caucasian male in NAD. AAO x 3.  Vascular CFT <3 seconds b/l LE. Faintly palpable DP pulses b/l LE. Diminished PT pulse(s) b/l LE. Pedal hair sparse. No pain with calf compression b/l. No edema noted b/l LE. Varicosities present b/l. No cyanosis or clubbing noted b/l LE.  Neurologic Pt has subjective symptoms of neuropathy. Protective sensation intact 5/5 intact bilaterally with 10g monofilament b/l.  Dermatologic Pedal integument with normal turgor, texture and tone b/l LE. No open wounds b/l. No interdigital macerations b/l. Toenails 1-5 b/l elongated, thickened, discolored with subungual debris. +Tenderness with dorsal palpation of nailplates. No hyperkeratotic or porokeratotic lesions present.  Orthopedic: Muscle strength 5/5 to all lower extremity muscle groups bilaterally. No pain, crepitus or joint limitation noted with ROM bilateral LE. No gross bony deformities bilaterally. Utilizes rollator for ambulation assistance.   Hemoglobin A1C Latest Ref Rng & Units 09/30/2020  HGBA1C 4.8 - 5.6 % 6.1(H)  Some recent data might be hidden   Assessment:  No diagnosis found. Plan:  Patient was evaluated and treated and all questions answered. Consent given for treatment as described below: -No new findings. No new orders. -Mycotic toenails 1-5 bilaterally were debrided in length and girth with sterile nail nippers and dremel without incident. -Patient/POA to  call should there be question/concern in the interim.  Return in about 3 months (around 12/07/2021).  Marzetta Board, DPM

## 2021-09-24 ENCOUNTER — Ambulatory Visit (INDEPENDENT_AMBULATORY_CARE_PROVIDER_SITE_OTHER): Payer: Medicare Other

## 2021-09-24 DIAGNOSIS — I441 Atrioventricular block, second degree: Secondary | ICD-10-CM

## 2021-09-24 LAB — CUP PACEART REMOTE DEVICE CHECK
Battery Remaining Longevity: 42 mo
Battery Voltage: 2.97 V
Brady Statistic AP VP Percent: 15.5 %
Brady Statistic AP VS Percent: 15.72 %
Brady Statistic AS VP Percent: 26.65 %
Brady Statistic AS VS Percent: 42.13 %
Brady Statistic RA Percent Paced: 30.91 %
Brady Statistic RV Percent Paced: 41.83 %
Date Time Interrogation Session: 20230309162606
Implantable Lead Implant Date: 20160524
Implantable Lead Implant Date: 20160524
Implantable Lead Location: 753859
Implantable Lead Location: 753860
Implantable Lead Model: 5076
Implantable Lead Model: 5076
Implantable Pulse Generator Implant Date: 20160524
Lead Channel Impedance Value: 418 Ohm
Lead Channel Impedance Value: 456 Ohm
Lead Channel Impedance Value: 532 Ohm
Lead Channel Impedance Value: 608 Ohm
Lead Channel Pacing Threshold Amplitude: 0.625 V
Lead Channel Pacing Threshold Amplitude: 1 V
Lead Channel Pacing Threshold Pulse Width: 0.4 ms
Lead Channel Pacing Threshold Pulse Width: 0.4 ms
Lead Channel Sensing Intrinsic Amplitude: 1.125 mV
Lead Channel Sensing Intrinsic Amplitude: 1.125 mV
Lead Channel Sensing Intrinsic Amplitude: 18.875 mV
Lead Channel Sensing Intrinsic Amplitude: 18.875 mV
Lead Channel Setting Pacing Amplitude: 1.25 V
Lead Channel Setting Pacing Amplitude: 2.25 V
Lead Channel Setting Pacing Pulse Width: 0.4 ms
Lead Channel Setting Sensing Sensitivity: 0.9 mV

## 2021-09-28 NOTE — Progress Notes (Signed)
Remote pacemaker transmission.   

## 2021-10-04 IMAGING — MR MR LUMBAR SPINE WO/W CM
4 of 7 series · 23 of 48 positions shown · IV contrast (Gadavist)
Comparison: 11/06/2019

CLINICAL DATA: Left leg weakness.

EXAM:
MRI LUMBAR SPINE WITHOUT AND WITH CONTRAST
TECHNIQUE: Multiplanar and multiecho pulse sequences of the lumbar spine were
obtained without and with intravenous contrast.
CONTRAST:  8.5mL GADAVIST GADOBUTROL 1 MMOL/ML IV SOLN

[Series 20: T2 · sagittal · 4.0mm · 0.73mm/px · 4 of 17 slices shown (1 of 2)]
[im 1/17]
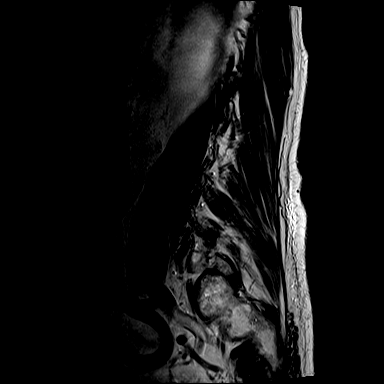
[im 6/17]
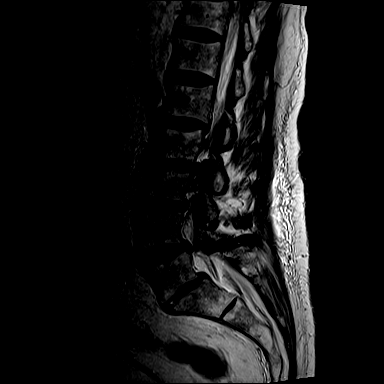
[im 11/17]
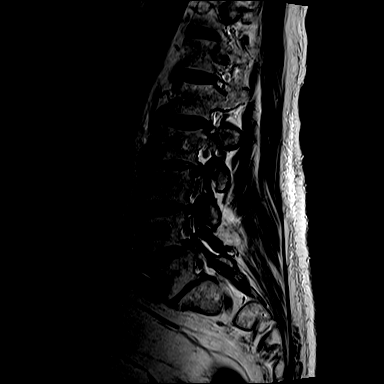
[im 17/17]
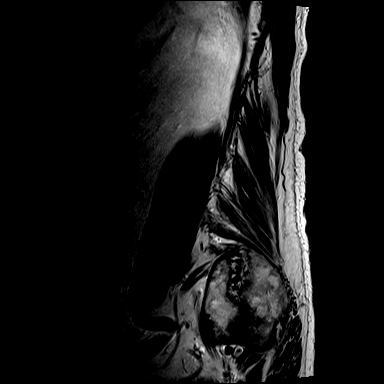

[Series 22: T1 · sagittal · 4.0mm · 0.88mm/px · 5 of 17 slices shown (1 of 2)]
[im 1/17]
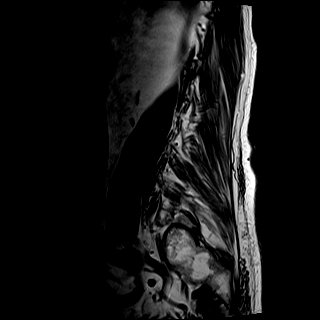
[im 5/17]
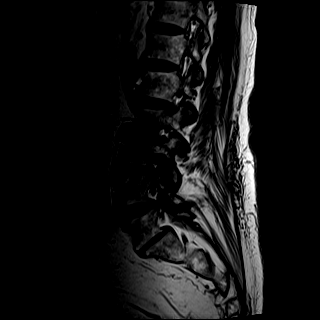
[im 9/17]
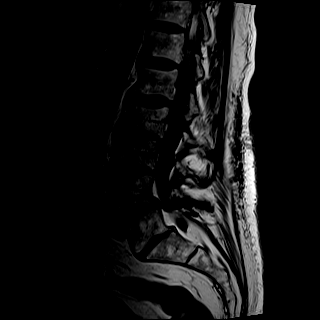
[im 13/17]
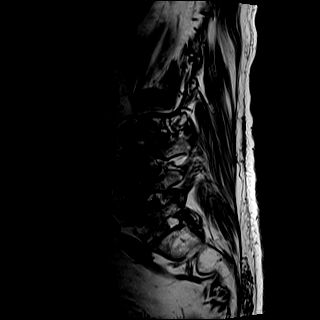
[im 17/17]
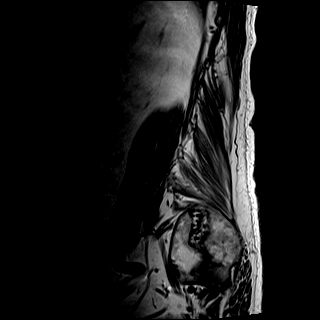

[Series 24: T2 · axial · 4.0mm · 0.57mm/px · z∈[-108,+85]mm · 8 of 36 slices shown (2 of 2)]
[im 1/36]
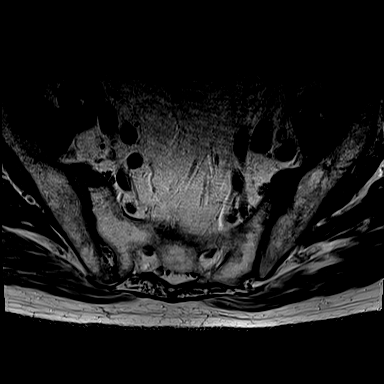
[im 4/36]
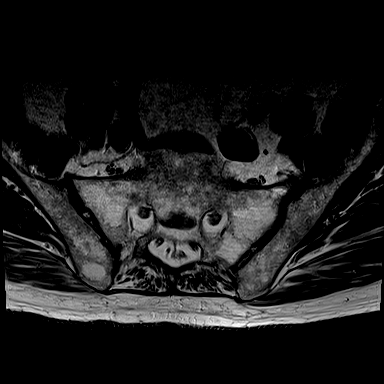
[im 12/36]
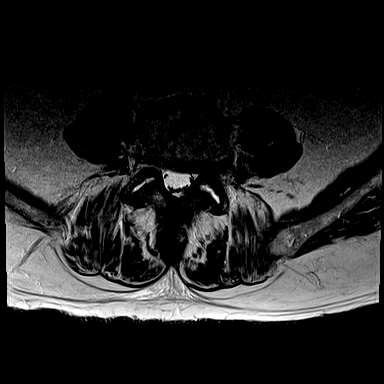
[im 16/36]
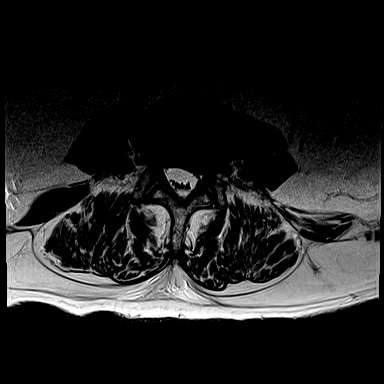
[im 20/36]
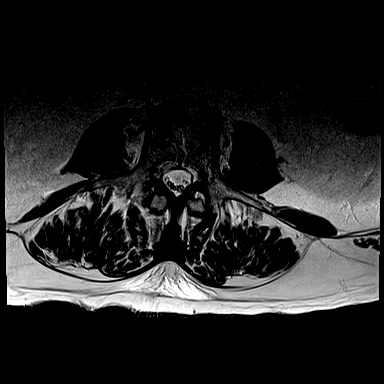
[im 24/36]
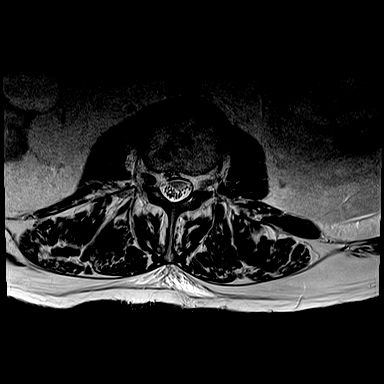
[im 32/36]
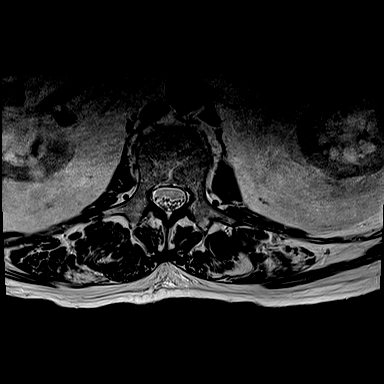
[im 36/36]
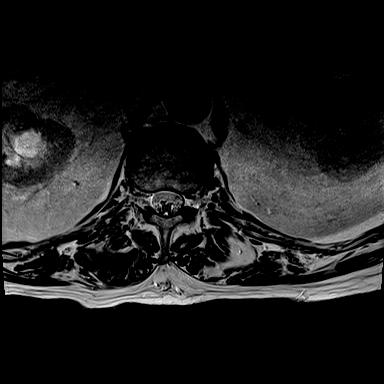

[Series 25: T1 · axial · 4.0mm · 0.34mm/px · z∈[-108,+64]mm · 6 of 36 slices shown (2 of 2)]
[im 1/36]
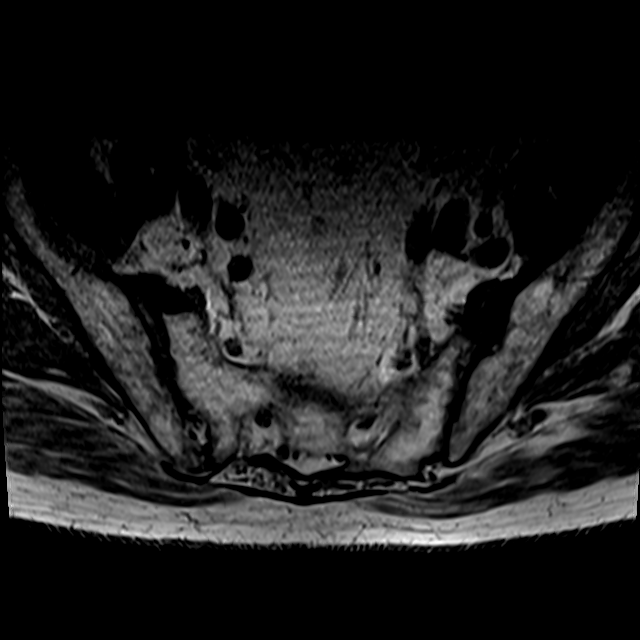
[im 4/36]
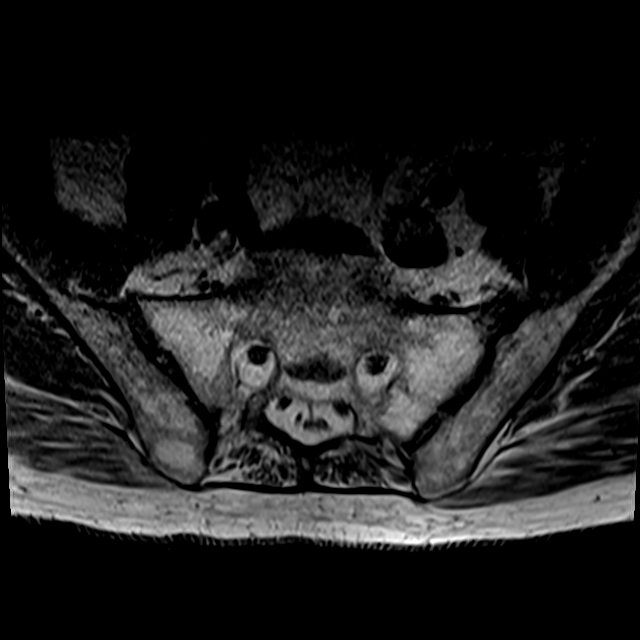
[im 12/36]
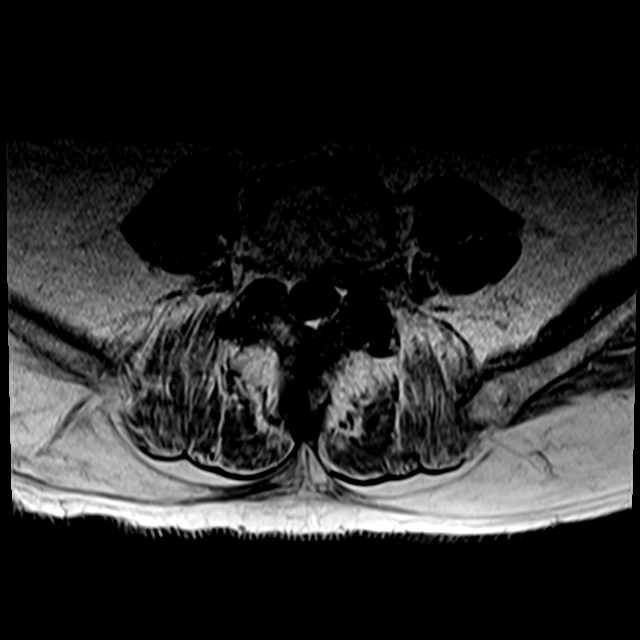
[im 16/36]
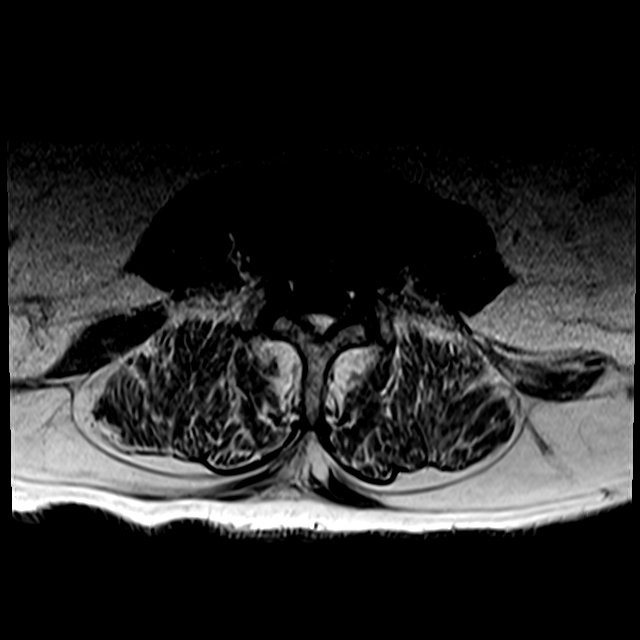
[im 20/36]
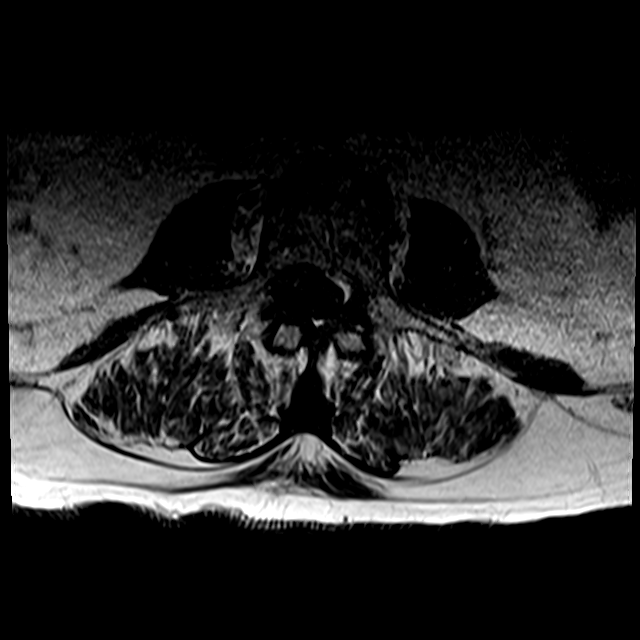
[im 32/36]
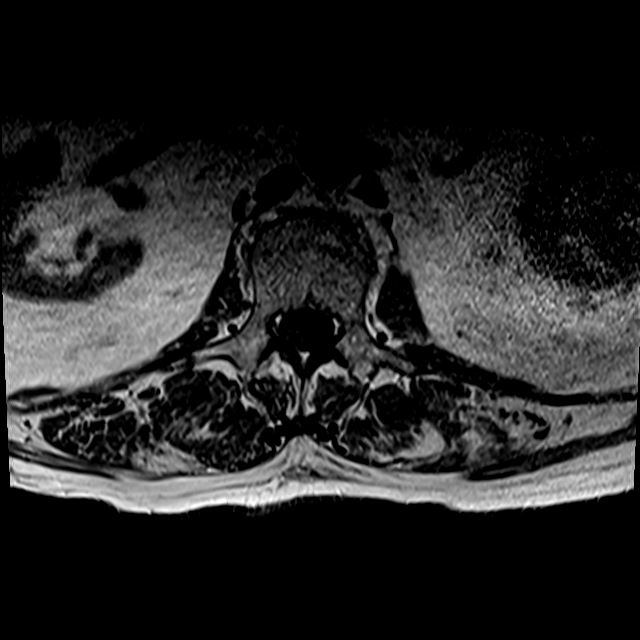

[23 of 48 positions shown; findings below may reference images not displayed]

FINDINGS: Segmentation:  Standard.

Alignment: Mild lumbar levoscoliosis. Unchanged trace retrolisthesis
of L1 on L2 and L2 on L3 and trace anterolisthesis of L4 on L5 and
L5 on S1.

Vertebrae: No fracture or suspicious osseous lesion. Mild
degenerative endplate edema at L2-3, decreased from prior.

Conus medullaris and cauda equina: Conus extends to the T12-L1
level. Conus and cauda equina appear normal.

Paraspinal and other soft tissues: Partially visualized bilateral
renal cysts.

Disc levels:

Disc desiccation throughout the lumbar spine. Chronic severe disc
space narrowing at L2-3 and L5-S1.

T12-L1: Negative.

L1-2: Mild disc bulging and mild facet arthrosis without stenosis,
unchanged.

L2-3: Disc bulging, a new or larger right subarticular disc
protrusion, endplate spurring, severe disc space height loss, and
mild facet and ligamentum flavum hypertrophy result in mild spinal
stenosis, moderate to severe right and moderate left lateral recess
stenosis, and mild-to-moderate right and mild left neural foraminal
stenosis. Potential bilateral L3 nerve root impingement,
particularly on the right.

L3-4: Disc bulging, a left subarticular to left foraminal disc
protrusion, endplate spurring, and mild facet and ligamentum flavum
hypertrophy result in mild right and moderate left lateral recess
stenosis and mild-to-moderate bilateral neural foraminal stenosis,
mildly progressed. No spinal stenosis.

L4-5: Anterolisthesis with bulging uncovered disc and moderate facet
hypertrophy result in mild spinal stenosis, mild bilateral lateral
recess stenosis, and mild right and moderate left neural foraminal
stenosis, stable to minimally progressed. Bilateral facet joint
effusions which can be seen in the setting of instability.

L5-S1: Anterolisthesis with mild bulging of uncovered disc and
severe facet hypertrophy result in mild bilateral neural foraminal
stenosis without spinal stenosis, unchanged. Right facet joint
ankylosis.
IMPRESSION: 1. New/larger right subarticular disc protrusion at L2-3 with
progressive right lower greater than left lateral recess stenosis.
2. Mild-to-moderate lateral recess and neural foraminal stenosis at
L3-4 and L4-5, mildly progressed.

## 2021-10-04 IMAGING — MR MR HEAD W/O CM
10 of 11 series · 43 of 48 positions shown · non-contrast
Comparison: Head CT 09/29/2020

CLINICAL DATA: Left leg weakness.

EXAM:
MRI HEAD WITHOUT CONTRAST
TECHNIQUE: Multiplanar, multiecho pulse sequences of the brain and surrounding
structures were obtained without intravenous contrast.

[Series 5: DWI · axial · 3.0mm · 0.88mm/px · z∈[-80,+65]mm · 10 of 100 slices shown (1 of 4)]
[im 1/100]
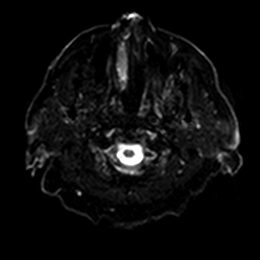
[im 12/100]
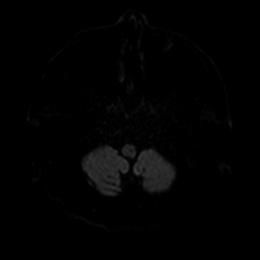
[im 23/100]
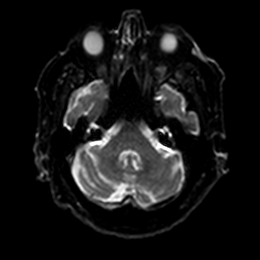
[im 34/100]
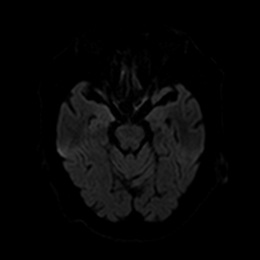
[im 45/100]
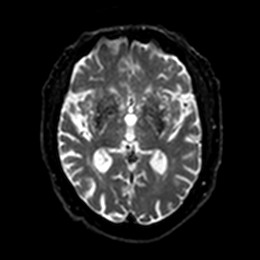
[im 56/100]
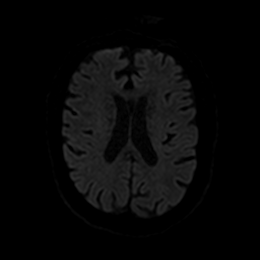
[im 67/100]
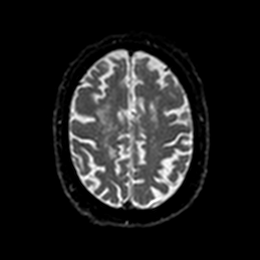
[im 78/100]
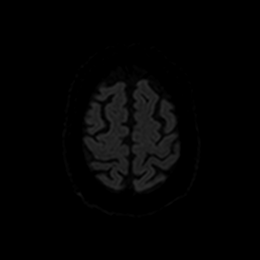
[im 89/100]
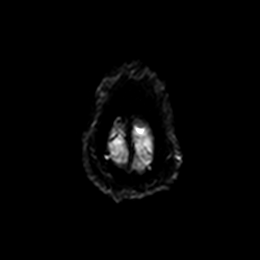
[im 100/100]
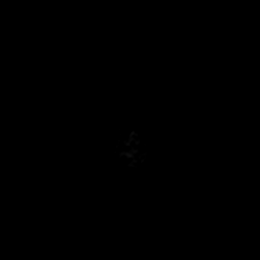

[Series 6: DWI · axial · 3.0mm · 0.88mm/px · z∈[-80,+65]mm · 5 of 50 slices shown (2 of 4)]
[im 1/50]
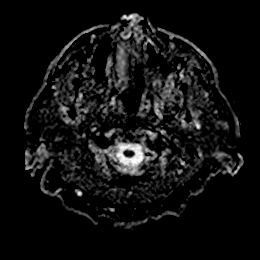
[im 13/50]
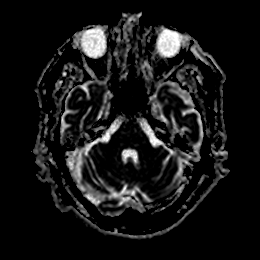
[im 25/50]
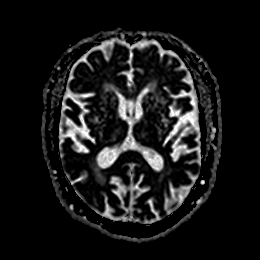
[im 37/50]
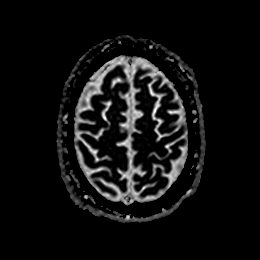
[im 50/50]
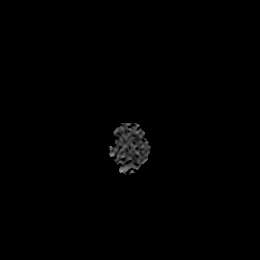

[Series 7: DWI · coronal · 4.0mm · 0.88mm/px · 6 of 66 slices shown (3 of 4)]
[im 1/66]
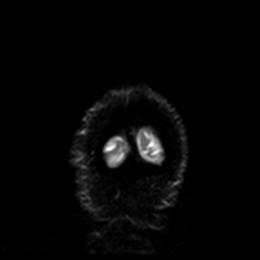
[im 14/66]
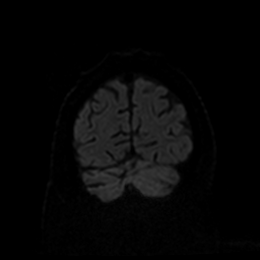
[im 27/66]
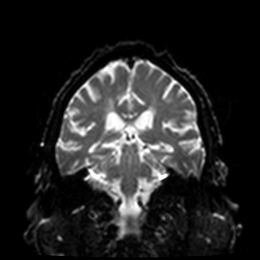
[im 40/66]
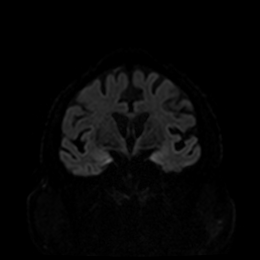
[im 53/66]
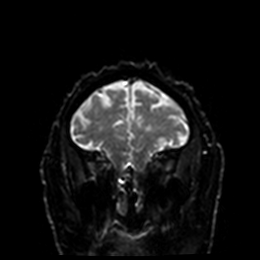
[im 66/66]
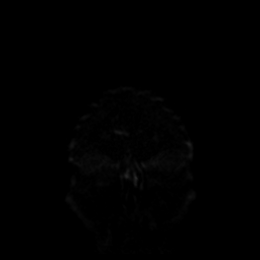

[Series 8: DWI · coronal · 4.0mm · 0.88mm/px · 3 of 33 slices shown (4 of 4)]
[im 1/33]
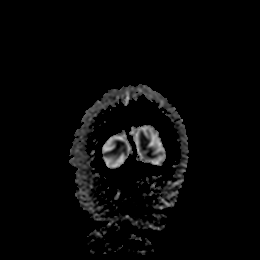
[im 17/33]
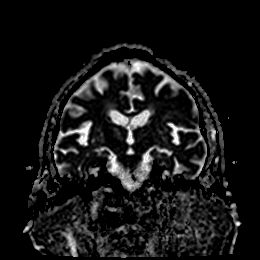
[im 33/33]
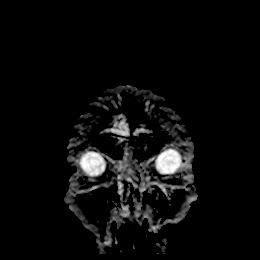

[Series 9: T1 · sagittal · 5.0mm · 0.75mm/px · 2 of 23 slices shown]
[im 1/23]
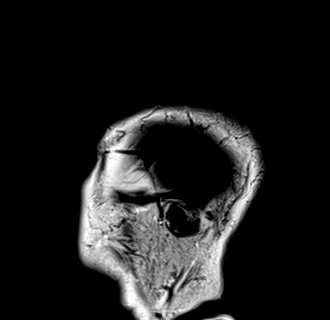
[im 23/23]
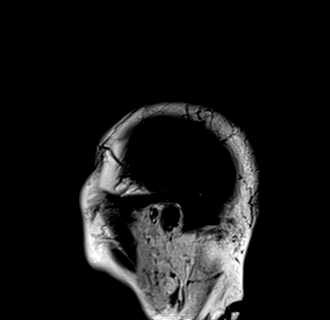

[Series 10: T2 · axial · 5.0mm · 0.72mm/px · z∈[-79,+63]mm · 2 of 25 slices shown (1 of 2)]
[im 1/25]
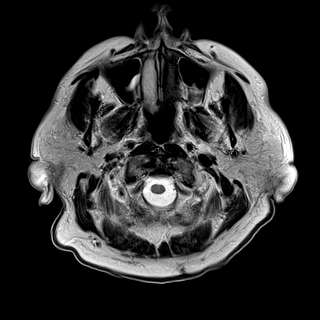
[im 25/25]
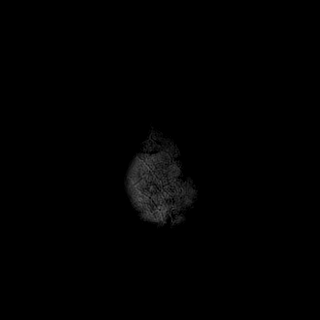

[Series 11: FLAIR · axial · 5.0mm · 0.45mm/px · z∈[-81,+61]mm · 2 of 25 slices shown]
[im 1/25]
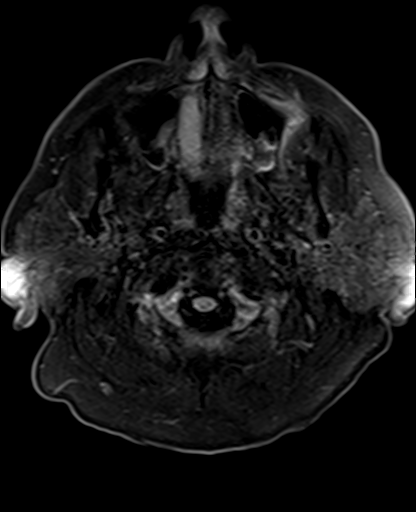
[im 25/25]
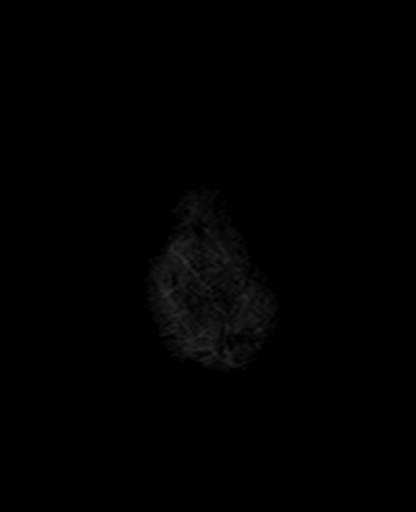

[Series 13: pha_images · axial · 3.0mm · 0.90mm/px · z∈[-97,+75]mm · 5 of 57 slices shown]
[im 1/57]
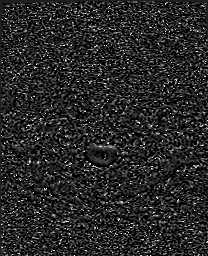
[im 15/57]
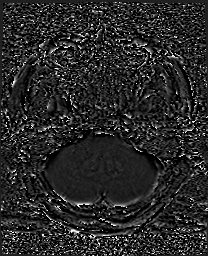
[im 29/57]
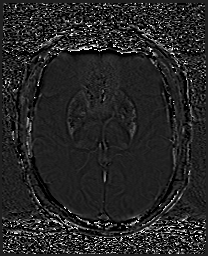
[im 43/57]
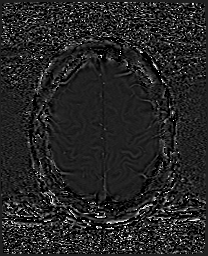
[im 57/57]
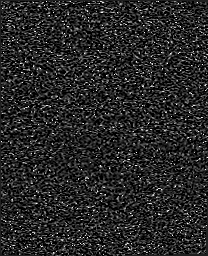

[Series 14: swi_images · axial · 3.0mm · 0.90mm/px · z∈[-97,+78]mm · 5 of 60 slices shown]
[im 1/60]
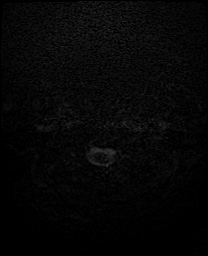
[im 15/60]
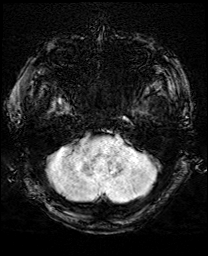
[im 30/60]
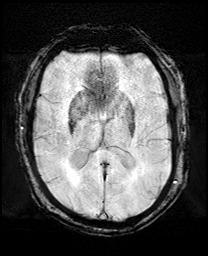
[im 45/60]
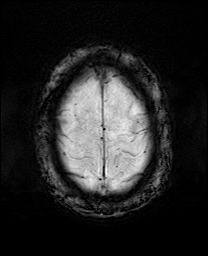
[im 60/60]
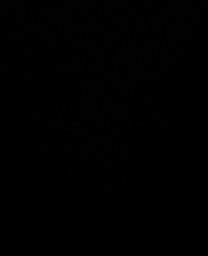

[Series 17: T2 · coronal · 5.0mm · 0.34mm/px · 3 of 29 slices shown (2 of 2)]
[im 1/29]
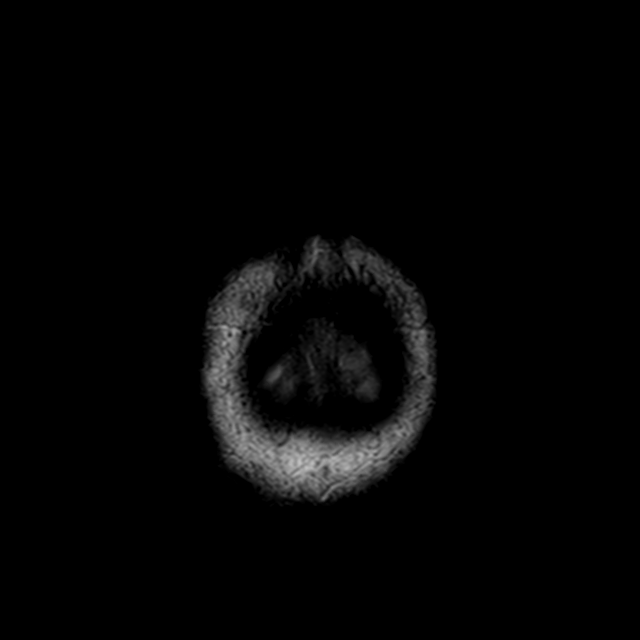
[im 15/29]
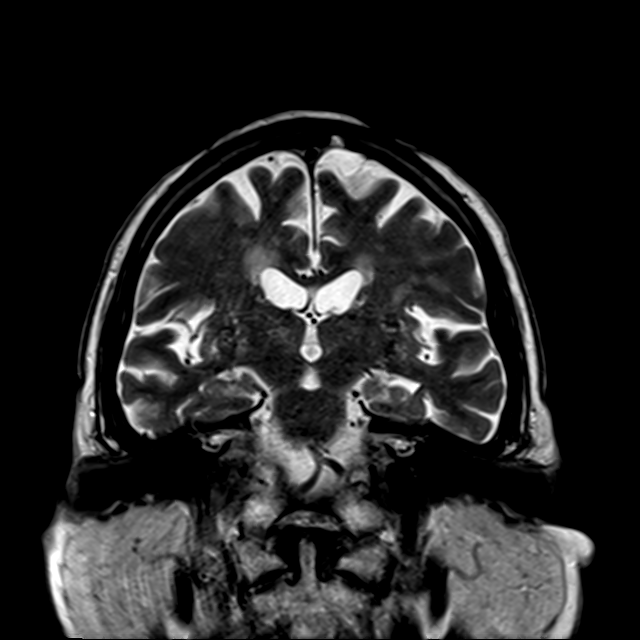
[im 29/29]
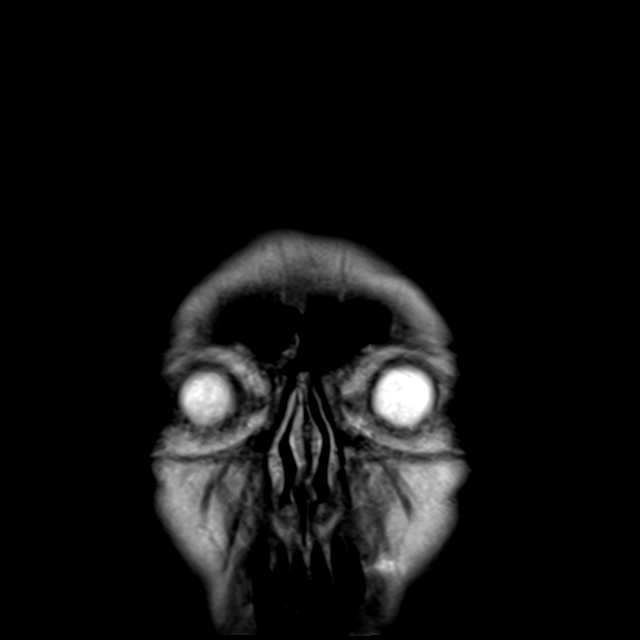

[43 of 48 positions shown; findings below may reference images not displayed]

FINDINGS: Brain: There is a 1 cm acute infarct in the right frontoparietal
white matter at the posterior aspect of the centrum semiovale. No
intracranial hemorrhage, mass, midline shift, or extra-axial fluid
collection is identified. Patchy T2 hyperintensities in the cerebral
white matter bilaterally are nonspecific but compatible with
moderate chronic small vessel ischemic disease. T2 hyperintensities
in the basal ganglia and surrounding white matter tracts bilaterally
likely reflect a combination of dilated perivascular spaces and
chronic lacunar infarcts. There is mild cerebral atrophy.

Vascular: Major intracranial vascular flow voids are preserved.

Skull and upper cervical spine: Unremarkable bone marrow signal.

Sinuses/Orbits: Bilateral cataract extraction. Minimal mucosal
thickening in the paranasal sinuses. Clear mastoid air cells.

Other: None.
IMPRESSION: 1. Small acute right frontoparietal white matter infarct.
2. Moderate chronic small vessel ischemic disease.

## 2021-12-09 ENCOUNTER — Ambulatory Visit (INDEPENDENT_AMBULATORY_CARE_PROVIDER_SITE_OTHER): Payer: Medicare Other | Admitting: Podiatry

## 2021-12-09 ENCOUNTER — Encounter: Payer: Self-pay | Admitting: Podiatry

## 2021-12-09 DIAGNOSIS — G629 Polyneuropathy, unspecified: Secondary | ICD-10-CM

## 2021-12-09 DIAGNOSIS — B351 Tinea unguium: Secondary | ICD-10-CM

## 2021-12-09 DIAGNOSIS — M79675 Pain in left toe(s): Secondary | ICD-10-CM | POA: Diagnosis not present

## 2021-12-09 DIAGNOSIS — L600 Ingrowing nail: Secondary | ICD-10-CM | POA: Diagnosis not present

## 2021-12-09 DIAGNOSIS — M79674 Pain in right toe(s): Secondary | ICD-10-CM

## 2021-12-13 NOTE — Progress Notes (Signed)
  Subjective:  Patient ID: Erik Mu., male    DOB: 03-02-1928,  MRN: [id number]  Erik Mu. presents to clinic today for painful thick toenails that are difficult to trim. Pain interferes with ambulation. Aggravating factors include wearing enclosed shoe gear. Pain is relieved with periodic professional debridement.  Patient is a resident of [facility].  He presents to clinic with band-aid on left 3rd toe. He cannot relate any trauma or history of what happened to his toe. He denies any pain, redness or drainage.  PCP is Mateo Flow, MD.  No Known Allergies  Review of Systems: Negative except as noted in the HPI.  Objective: No changes noted in today's physical examination. Constitutional Patient is a pleasant 88 y.o. Caucasian male in NAD. AAO x 3.  Vascular CFT <3 seconds b/l LE. Faintly palpable DP pulses b/l LE. Diminished PT pulse(s) b/l LE. Pedal hair sparse. No pain with calf compression b/l. No edema noted b/l LE. Varicosities present b/l. No cyanosis or clubbing noted b/l LE.  Neurologic Pt has subjective symptoms of neuropathy. Protective sensation intact 5/5 intact bilaterally with 10g monofilament b/l.  Dermatologic Pedal integument with normal turgor, texture and tone b/l LE. No open wounds b/l. No interdigital macerations b/l. Toenails 1-5 b/l elongated, thickened, discolored with subungual debris. +Tenderness with dorsal palpation of nailplates.  Left 3rd digit with dried heme at distal tip. No erythema, no edema, no drainage, no fluctuance.. Incurvated nailplate lateral border right 2nd digit.  Nail border hypertrophy absent. There is tenderness to palpation. Sign(s) of infection: no clinical signs of infection noted on examination today.. No hyperkeratotic or porokeratotic lesions present.  Orthopedic: Muscle strength 5/5 to all lower extremity muscle groups bilaterally. No pain, crepitus or joint limitation noted with ROM bilateral  LE. No gross bony deformities bilaterally. Utilizes rollator for ambulation assistance.   Assessment/Plan: 1. Pain due to onychomycosis of toenails of both feet   2. Ingrown toenail without infection   3. Neuropathy     -Patient was evaluated and treated. All patient's and/or POA's questions/concerns answered on today's visit. -Left 3rd digit healed. No treatment required. -Toenails 1-5 b/l were debrided in length and girth with sterile nail nippers and dremel without iatrogenic bleeding.  -Offending nail border debrided and curretaged R 2nd toe utilizing sterile nail nipper and currette. Border(s) cleansed with alcohol and TAO applied. Orders written for facility to apply triple antibiotic ointment  to R 2nd toe once daily for 7 days. Call office if there are any concerns. -Patient/POA to call should there be question/concern in the interim.   Return in about 3 months (around 03/11/2022).  Marzetta Board, DPM

## 2021-12-24 ENCOUNTER — Ambulatory Visit (INDEPENDENT_AMBULATORY_CARE_PROVIDER_SITE_OTHER): Payer: Medicare Other

## 2021-12-24 DIAGNOSIS — I441 Atrioventricular block, second degree: Secondary | ICD-10-CM

## 2021-12-27 LAB — CUP PACEART REMOTE DEVICE CHECK
Battery Remaining Longevity: 42 mo
Battery Voltage: 2.96 V
Brady Statistic AP VP Percent: 12.8 %
Brady Statistic AP VS Percent: 24.05 %
Brady Statistic AS VP Percent: 13.2 %
Brady Statistic AS VS Percent: 49.96 %
Brady Statistic RA Percent Paced: 36.8 %
Brady Statistic RV Percent Paced: 26.2 %
Date Time Interrogation Session: 20230611115351
Implantable Lead Implant Date: 20160524
Implantable Lead Implant Date: 20160524
Implantable Lead Location: 753859
Implantable Lead Location: 753860
Implantable Lead Model: 5076
Implantable Lead Model: 5076
Implantable Pulse Generator Implant Date: 20160524
Lead Channel Impedance Value: 399 Ohm
Lead Channel Impedance Value: 456 Ohm
Lead Channel Impedance Value: 494 Ohm
Lead Channel Impedance Value: 570 Ohm
Lead Channel Pacing Threshold Amplitude: 0.625 V
Lead Channel Pacing Threshold Amplitude: 1 V
Lead Channel Pacing Threshold Pulse Width: 0.4 ms
Lead Channel Pacing Threshold Pulse Width: 0.4 ms
Lead Channel Sensing Intrinsic Amplitude: 2 mV
Lead Channel Sensing Intrinsic Amplitude: 2 mV
Lead Channel Sensing Intrinsic Amplitude: 6.625 mV
Lead Channel Sensing Intrinsic Amplitude: 6.625 mV
Lead Channel Setting Pacing Amplitude: 1.25 V
Lead Channel Setting Pacing Amplitude: 2 V
Lead Channel Setting Pacing Pulse Width: 0.4 ms
Lead Channel Setting Sensing Sensitivity: 0.9 mV

## 2021-12-30 NOTE — Progress Notes (Signed)
Remote pacemaker transmission.   

## 2022-03-17 ENCOUNTER — Ambulatory Visit: Payer: Medicare Other | Admitting: Podiatry

## 2022-04-07 ENCOUNTER — Encounter: Payer: Self-pay | Admitting: Podiatry

## 2022-04-07 ENCOUNTER — Ambulatory Visit (INDEPENDENT_AMBULATORY_CARE_PROVIDER_SITE_OTHER): Payer: Medicare Other | Admitting: Podiatry

## 2022-04-07 DIAGNOSIS — M79675 Pain in left toe(s): Secondary | ICD-10-CM | POA: Diagnosis not present

## 2022-04-07 DIAGNOSIS — G629 Polyneuropathy, unspecified: Secondary | ICD-10-CM | POA: Diagnosis not present

## 2022-04-07 DIAGNOSIS — M79674 Pain in right toe(s): Secondary | ICD-10-CM

## 2022-04-07 DIAGNOSIS — B351 Tinea unguium: Secondary | ICD-10-CM | POA: Diagnosis not present

## 2022-04-07 NOTE — Progress Notes (Signed)
  Subjective:  Patient ID: Erik Mu., male    DOB: 1928/05/23,  MRN: [id number]  Erik Mu. presents to clinic today for painful elongated mycotic toenails 1-5 bilaterally which are tender when wearing enclosed shoe gear. Pain is relieved with periodic professional debridement.  Patient lives at [facility].  New problem(s): None.   PCP is Mateo Flow, MD. Patient unaware of date of last visit.  No Known Allergies  Review of Systems: Negative except as noted in the HPI.  Objective: No changes noted in today's physical examination. Erik Brock. is a pleasant 88 y.o. male in NAD. AAO x 3. Vascular CFT <3 seconds b/l LE. Faintly palpable DP pulses b/l LE. Diminished PT pulse(s) b/l LE. Pedal hair sparse. No pain with calf compression b/l. No edema noted b/l LE. Varicosities present b/l. No cyanosis or clubbing noted b/l LE.  Neurologic Pt has subjective symptoms of neuropathy. Protective sensation intact 5/5 intact bilaterally with 10g monofilament b/l.  Dermatologic Pedal integument with normal turgor, texture and tone b/l LE. No open wounds b/l. No interdigital macerations b/l. Toenails 2-5 b/l elongated, thickened, discolored with subungual debris. +Tenderness with dorsal palpation of nailplates. Anonychia noted bilateral great toes. Nailbed(s) epithelialized.  No hyperkeratotic or porokeratotic lesions present.  Orthopedic: Muscle strength 5/5 to all lower extremity muscle groups bilaterally. No pain, crepitus or joint limitation noted with ROM bilateral LE. No gross bony deformities bilaterally. Utilizes rollator for ambulation assistance.   Assessment/Plan: 1. Pain due to onychomycosis of toenails of both feet   2. Neuropathy   -Examined patient. -Patient to continue soft, supportive shoe gear daily. -Mycotic toenails 1-5 bilaterally were debrided in length and girth with sterile nail nippers and dremel without incident. -Patient/POA to  call should there be question/concern in the interim.   Return in about 3 months (around 07/07/2022).  Marzetta Board, DPM

## 2022-04-15 ENCOUNTER — Ambulatory Visit: Payer: Medicare Other | Attending: Cardiology | Admitting: Cardiology

## 2022-04-15 ENCOUNTER — Encounter: Payer: Self-pay | Admitting: Cardiology

## 2022-04-15 ENCOUNTER — Ambulatory Visit (INDEPENDENT_AMBULATORY_CARE_PROVIDER_SITE_OTHER): Payer: Medicare Other

## 2022-04-15 VITALS — BP 126/70 | HR 76 | Ht 67.0 in | Wt 225.2 lb

## 2022-04-15 DIAGNOSIS — I2510 Atherosclerotic heart disease of native coronary artery without angina pectoris: Secondary | ICD-10-CM

## 2022-04-15 DIAGNOSIS — I110 Hypertensive heart disease with heart failure: Secondary | ICD-10-CM | POA: Diagnosis present

## 2022-04-15 DIAGNOSIS — Z950 Presence of cardiac pacemaker: Secondary | ICD-10-CM

## 2022-04-15 DIAGNOSIS — I441 Atrioventricular block, second degree: Secondary | ICD-10-CM

## 2022-04-15 DIAGNOSIS — Z8673 Personal history of transient ischemic attack (TIA), and cerebral infarction without residual deficits: Secondary | ICD-10-CM

## 2022-04-15 DIAGNOSIS — E782 Mixed hyperlipidemia: Secondary | ICD-10-CM | POA: Diagnosis present

## 2022-04-15 MED ORDER — GABAPENTIN 300 MG PO CAPS: 300.0000 mg | ORAL_CAPSULE | Freq: Every day | ORAL | 3 refills | Status: DC

## 2022-04-15 MED ORDER — TORSEMIDE 40 MG PO TABS: 40.0000 mg | ORAL_TABLET | Freq: Two times a day (BID) | ORAL | 3 refills | Status: DC

## 2022-04-15 NOTE — Patient Instructions (Signed)
Medication Instructions:  Your physician has recommended you make the following change in your medication:   START: Gabapentin 300 mg daily START: Torsemide 40 mg twice daily  *If you need a refill on your cardiac medications before your next appointment, please call your pharmacy*   Lab Work: Your physician recommends that you return for lab work in:   Labs today: BMP, Pro BNP  If you have labs (blood work) drawn today and your tests are completely normal, you will receive your results only by: MyChart Message (if you have MyChart) OR A paper copy in the mail If you have any lab test that is abnormal or we need to change your treatment, we will call you to review the results.   Testing/Procedures: None   Follow-Up: At [facility], you and your health needs are our priority.  As part of our continuing mission to provide you with exceptional heart care, we have created designated Provider Care Teams.  These Care Teams include your primary Cardiologist (physician) and Advanced Practice Providers (APPs -  Physician Assistants and Nurse Practitioners) who all work together to provide you with the care you need, when you need it.  We recommend signing up for the patient portal called "MyChart".  Sign up information is provided on this After Visit Summary.  MyChart is used to connect with patients for Virtual Visits (Telemedicine).  Patients are able to view lab/test results, encounter notes, upcoming appointments, etc.  Non-urgent messages can be sent to your provider as well.   To learn more about what you can do with MyChart, go to [email or web address].    Your next appointment:   6 month(s)  The format for your next appointment:   In Person  Provider:   Shirlee More, MD    Other Instructions None  Important Information About Sugar

## 2022-04-15 NOTE — Addendum Note (Signed)
Addended by: Edwyna Shell I on: 04/15/2022 11:52 AM   Modules accepted: Orders

## 2022-04-15 NOTE — Progress Notes (Signed)
Cardiology Office Note:    Date:  04/15/2022   ID:  Erik Mu., DOB January 12, 1928, MRN [id number]  PCP:  Erik Flow, MD  Cardiologist:  Erik More, MD    Referring MD: Erik Flow, MD    ASSESSMENT:    1. Hypertensive heart disease with heart failure ([place])   2. AV block, Mobitz II   3. Cardiac pacemaker in situ   4. Coronary artery disease involving native coronary artery of native heart without angina pectoris   5. History of CVA (cerebrovascular accident)   6. Mixed hyperlipidemia    PLAN:    In order of problems listed above:  Unfortunately his heart failure is again decompensated I suspect part of the problem is gabapentin that can cause intense sodium retention will decrease it to once a day and try to discontinue increase his loop diuretic to try to avoid repeat hospitalization for high rate of IV diuretic therapy.  Today my office recheck BMP proBNP Stable pacemaker function followed by device clinic Stable CAD having no anginal discomfort continue his medical therapy including clopidogrel and statin See above same therapy as CAD Continue his statin his son relates lipids are followed at the [facility]    Next appointment: 6 months   Medication Adjustments/Labs and Tests Ordered: Current medicines are reviewed at length with the patient today.  Concerns regarding medicines are outlined above.  No orders of the defined types were placed in this encounter.  No orders of the defined types were placed in this encounter.   Chief Complaint  Patient presents with   Follow-up   Hypertension   Congestive Heart Failure   pacemaker    History of Present Illness:    Erik Buffalo. is a 88 y.o. male with a hx of hypertensive heart disease with chronic diastolic heart failure coronary artery disease sick sinus syndrome with dual-chamber pacemaker and right middle cerebral artery stroke last seen 07/26/2021.  Echocardiogram performed in [facility]  04/30/2021 a mildly reduced ejection fraction 45 to 50% and mild aortic regurgitation  His last remote pacemaker checks 12/26/2021 showed normal device parameters lead function he has had brief episodes of nonsustained VT 1 to 3 seconds in duration.  He is ventricularly paced 26% of the time and atrially paced approximately 60% of the time  Compliance with diet, lifestyle and medications: Yes  His son is present.  In June he had a brief hospital stay with increased edema and leg oozing received IV diuretics was improved but he is having the same problem now with increasing edema above the knees. From back pain he has been taking gabapentin which causes intense sodium retention I have asked the staff to reduce his gabapentin to once a day and try to discontinue emigrant increase his torsemide to avoid repeat hospitalization Fortunately despite his above the knee edema he is not short of breath no chest pain palpitation or syncope Past Medical History:  Diagnosis Date   Ascending aortic aneurysm ([place]) 09/05/2017   AV block, Mobitz II 12/08/2014   Bleeding nose    CAD (coronary artery disease) 12/08/2014   Overview:  Mild, nonobsructive  Overview:  Mild, nonobsructive   Cardiac pacemaker in situ 12/22/2014   Cardiomyopathy ([place]) 09/05/2017   Chronic diastolic heart failure ([place]) 04/05/2015   Dyslipidemia 04/05/2015   Elevated PSA 07/30/2013   Essential (primary) hypertension 07/30/2013   GERD (gastroesophageal reflux disease) 07/30/2013   Gonalgia 11/11/2014   Hyperlipidemia 07/30/2013  Hypertension    Hypertensive heart disease with heart failure ([place]) 04/05/2015   LBBB (left bundle branch block) 12/08/2014   Lumbar stenosis 07/30/2013   Neuritis or radiculitis due to rupture of lumbar intervertebral disc 11/11/2014   Pacemaker reprogramming/check 12/22/2014   Thyroid disease    Trigger point of right shoulder region 10/14/2014    Past Surgical History:  Procedure Laterality Date   APPENDECTOMY      INSERT / REPLACE / REMOVE PACEMAKER     tonsills      Current Medications: No outpatient medications have been marked as taking for the 04/15/22 encounter (Office Visit) with Richardo Priest, MD.     Allergies:   Patient has no known allergies.   Social History   Socioeconomic History   Marital status: Widowed    Spouse name: Not on file   Number of children: Not on file   Years of education: Not on file   Highest education level: Not on file  Occupational History   Not on file  Tobacco Use   Smoking status: Never    Passive exposure: Never   Smokeless tobacco: Never  Vaping Use   Vaping Use: Never used  Substance and Sexual Activity   Alcohol use: No   Drug use: No   Sexual activity: Not on file  Other Topics Concern   Not on file  Social History Narrative   Not on file   Social Determinants of Health   Financial Resource Strain: Not on file  Food Insecurity: Not on file  Transportation Needs: Not on file  Physical Activity: Not on file  Stress: Not on file  Social Connections: Not on file     Family History: The patient's family history includes Brain cancer in his father. ROS:   Please see the history of present illness.    All other systems reviewed and are negative.  EKGs/Labs/Other Studies Reviewed:    The following studies were reviewed today:  EKG:  EKG ordered today and personally reviewed.  The ekg ordered today demonstrates dual-chamber paced rhythm  Recent Labs: No results found for requested labs within last 365 days.  Recent Lipid Panel    Component Value Date/Time   CHOL 147 09/30/2020 0405   CHOL 164 04/07/2020 1048   TRIG 126 09/30/2020 0405   HDL 29 (L) 09/30/2020 0405   HDL 39 (L) 04/07/2020 1048   CHOLHDL 5.1 09/30/2020 0405   VLDL 25 09/30/2020 0405   LDLCALC 93 09/30/2020 0405   LDLCALC 96 04/07/2020 1048    Physical Exam:    VS:  There were no vitals taken for this visit.    Wt Readings from Last 3 Encounters:   07/26/21 215 lb (97.5 kg)  02/09/21 200 lb (90.7 kg)  01/14/21 199 lb 6.4 oz (90.4 kg)     GEN: He appears his age well nourished, well developed in no acute distress HEENT: Normal NECK: No JVD; No carotid bruits LYMPHATICS: No lymphadenopathy CARDIAC: RRR, no murmurs, rubs, gallops RESPIRATORY:  Clear to auscultation without rales, wheezing or rhonchi  ABDOMEN: Soft, non-tender, non-distended MUSCULOSKELETAL: He has greater than 4+ bilateral above-the-knee lower extremity pitting edema; No deformity  SKIN: Warm and dry NEUROLOGIC:  Alert and oriented x 3 PSYCHIATRIC:  Normal affect    Signed, Erik More, MD  04/15/2022 11:23 AM    [place]

## 2022-04-16 LAB — BASIC METABOLIC PANEL
BUN/Creatinine Ratio: 20 (ref 10–24)
BUN: 29 mg/dL (ref 10–36)
CO2: 24 mmol/L (ref 20–29)
Calcium: 8.8 mg/dL (ref 8.6–10.2)
Chloride: 103 mmol/L (ref 96–106)
Creatinine, Ser: 1.48 mg/dL — ABNORMAL HIGH (ref 0.76–1.27)
Glucose: 90 mg/dL (ref 70–99)
Potassium: 4.3 mmol/L (ref 3.5–5.2)
Sodium: 144 mmol/L (ref 134–144)
eGFR: 44 mL/min/{1.73_m2} — ABNORMAL LOW (ref >59)

## 2022-04-16 LAB — PRO B NATRIURETIC PEPTIDE: NT-Pro BNP: 76 pg/mL (ref 0–486)

## 2022-04-19 LAB — CUP PACEART REMOTE DEVICE CHECK
Battery Remaining Longevity: 36 mo
Battery Voltage: 2.95 V
Brady Statistic AP VP Percent: 14.15 %
Brady Statistic AP VS Percent: 22.66 %
Brady Statistic AS VP Percent: 9.69 %
Brady Statistic AS VS Percent: 53.49 %
Brady Statistic RA Percent Paced: 36.75 %
Brady Statistic RV Percent Paced: 24.01 %
Date Time Interrogation Session: 20230929152245
Implantable Lead Implant Date: 20160524
Implantable Lead Implant Date: 20160524
Implantable Lead Location: 753859
Implantable Lead Location: 753860
Implantable Lead Model: 5076
Implantable Lead Model: 5076
Implantable Pulse Generator Implant Date: 20160524
Lead Channel Impedance Value: 361 Ohm
Lead Channel Impedance Value: 418 Ohm
Lead Channel Impedance Value: 494 Ohm
Lead Channel Impedance Value: 551 Ohm
Lead Channel Pacing Threshold Amplitude: 0.625 V
Lead Channel Pacing Threshold Amplitude: 1 V
Lead Channel Pacing Threshold Pulse Width: 0.4 ms
Lead Channel Pacing Threshold Pulse Width: 0.4 ms
Lead Channel Sensing Intrinsic Amplitude: 1.25 mV
Lead Channel Sensing Intrinsic Amplitude: 1.25 mV
Lead Channel Sensing Intrinsic Amplitude: 4.5 mV
Lead Channel Sensing Intrinsic Amplitude: 4.5 mV
Lead Channel Setting Pacing Amplitude: 1.25 V
Lead Channel Setting Pacing Amplitude: 2 V
Lead Channel Setting Pacing Pulse Width: 0.4 ms
Lead Channel Setting Sensing Sensitivity: 0.9 mV

## 2022-04-20 NOTE — Progress Notes (Signed)
Remote pacemaker transmission.   

## 2022-04-25 ENCOUNTER — Telehealth: Payer: Self-pay | Admitting: *Deleted

## 2022-04-25 NOTE — Telephone Encounter (Signed)
error 

## 2022-07-21 ENCOUNTER — Ambulatory Visit (INDEPENDENT_AMBULATORY_CARE_PROVIDER_SITE_OTHER): Payer: Medicare Other | Admitting: Podiatry

## 2022-07-21 ENCOUNTER — Encounter: Payer: Self-pay | Admitting: Podiatry

## 2022-07-21 VITALS — BP 158/98

## 2022-07-21 DIAGNOSIS — G629 Polyneuropathy, unspecified: Secondary | ICD-10-CM

## 2022-07-21 DIAGNOSIS — B351 Tinea unguium: Secondary | ICD-10-CM

## 2022-07-21 DIAGNOSIS — M79674 Pain in right toe(s): Secondary | ICD-10-CM | POA: Diagnosis not present

## 2022-07-21 DIAGNOSIS — M79675 Pain in left toe(s): Secondary | ICD-10-CM | POA: Diagnosis not present

## 2022-07-21 DIAGNOSIS — L600 Ingrowing nail: Secondary | ICD-10-CM

## 2022-07-21 NOTE — Progress Notes (Signed)
  Subjective:  Patient ID: Erik Mu., male    DOB: 01/14/1928,  MRN: [id number]  Erik Mu. presents to clinic today for painful thick toenails that are difficult to trim. Pain interferes with ambulation. Aggravating factors include wearing enclosed shoe gear. Pain is relieved with periodic professional debridement.  Chief Complaint  Patient presents with   Nail Problem    Nail trim  Not diabetic PCP - Dr Humphrey Rolls, last OV 05/2022   New problem(s): None.   He is a resident of [facility].  PCP is Garwin Brothers, MD.  No Known Allergies  Review of Systems: Negative except as noted in the HPI.  Objective:  Vitals:   07/21/22 1045  BP: (!) 158/98   Erik Morin Erik Gates. is a pleasant 89 y.o. male morbidly obese in NAD. AAO x 3. Vascular CFT <3 seconds b/l LE. Faintly palpable DP pulses b/l LE. Diminished PT pulse(s) b/l LE. Pedal hair sparse. No pain with calf compression b/l. No edema noted b/l LE. Varicosities present b/l. No cyanosis or clubbing noted b/l LE.  Neurologic Pt has subjective symptoms of neuropathy. Protective sensation intact 5/5 intact bilaterally with 10g monofilament b/l.  Dermatologic Pedal integument with normal turgor, texture and tone b/l LE. No open wounds b/l. No interdigital macerations b/l. Toenails 2-5 b/l elongated, thickened, discolored with subungual debris. +Tenderness with dorsal palpation of nailplates. Anonychia noted bilateral great toes. Nailbed(s) epithelialized.  No hyperkeratotic or porokeratotic lesions present.  Incurvated nailplate both nail borders of R 2nd toe.  Nail border hypertrophy absent. There is tenderness to palpation. Sign(s) of infection: no clinical signs of infection noted on examination today..  Orthopedic: Muscle strength 5/5 to all lower extremity muscle groups bilaterally. No pain, crepitus or joint limitation noted with ROM bilateral LE. No gross bony deformities bilaterally. Utilizes rollator  for ambulation assistance.   Assessment/Plan: 1. Pain due to onychomycosis of toenails of both feet   2. Ingrown toenail without infection     No orders of the defined types were placed in this encounter.   -Patient was evaluated and treated. All patient's and/or POA's questions/concerns answered on today's visit. -Order written for facility to treat right 2nd toe once daily for one week. See below. -Continue supportive shoe gear daily. -Toenails 1-5 left foot, 3-5 right foot, and R hallux debrided in length and girth without iatrogenic bleeding with sterile nail nipper and dremel.  -No invasive procedure(s) performed. Offending nail border debrided and curretaged both nail borders of R 2nd toe utilizing sterile nail nipper and currette. Border(s) cleansed with alcohol and triple antibiotic ointment applied. Patient/POA/Caregiver/Facility instructed to apply triple antibiotic ointment  to R 2nd toe once daily for 7 days. Call office if there are any concerns. -Patient/POA to call should there be question/concern in the interim.   Return in about 3 months (around 10/20/2022).  Marzetta Board, DPM

## 2022-07-29 DIAGNOSIS — S0100XD Unspecified open wound of scalp, subsequent encounter: Secondary | ICD-10-CM

## 2022-07-29 DIAGNOSIS — F0390 Unspecified dementia without behavioral disturbance: Secondary | ICD-10-CM

## 2022-07-29 DIAGNOSIS — C4440 Unspecified malignant neoplasm of skin of scalp and neck: Secondary | ICD-10-CM

## 2022-07-29 DIAGNOSIS — I739 Peripheral vascular disease, unspecified: Secondary | ICD-10-CM

## 2022-08-17 DIAGNOSIS — F0390 Unspecified dementia without behavioral disturbance: Secondary | ICD-10-CM

## 2022-08-17 DIAGNOSIS — D649 Anemia, unspecified: Secondary | ICD-10-CM | POA: Diagnosis not present

## 2022-08-17 DIAGNOSIS — E039 Hypothyroidism, unspecified: Secondary | ICD-10-CM | POA: Diagnosis not present

## 2022-08-17 DIAGNOSIS — N1832 Chronic kidney disease, stage 3b: Secondary | ICD-10-CM | POA: Diagnosis not present

## 2022-08-17 DIAGNOSIS — R799 Abnormal finding of blood chemistry, unspecified: Secondary | ICD-10-CM | POA: Diagnosis not present

## 2022-08-24 DIAGNOSIS — R799 Abnormal finding of blood chemistry, unspecified: Secondary | ICD-10-CM | POA: Diagnosis not present

## 2022-08-24 DIAGNOSIS — N1832 Chronic kidney disease, stage 3b: Secondary | ICD-10-CM | POA: Diagnosis not present

## 2022-08-24 DIAGNOSIS — F0390 Unspecified dementia without behavioral disturbance: Secondary | ICD-10-CM | POA: Diagnosis not present

## 2022-08-24 DIAGNOSIS — E039 Hypothyroidism, unspecified: Secondary | ICD-10-CM | POA: Diagnosis not present

## 2022-09-09 DIAGNOSIS — R634 Abnormal weight loss: Secondary | ICD-10-CM | POA: Diagnosis not present

## 2022-09-09 DIAGNOSIS — R600 Localized edema: Secondary | ICD-10-CM | POA: Diagnosis not present

## 2022-09-09 DIAGNOSIS — F0390 Unspecified dementia without behavioral disturbance: Secondary | ICD-10-CM | POA: Diagnosis not present

## 2022-09-09 DIAGNOSIS — I5032 Chronic diastolic (congestive) heart failure: Secondary | ICD-10-CM | POA: Diagnosis not present

## 2022-09-14 DIAGNOSIS — M6281 Muscle weakness (generalized): Secondary | ICD-10-CM

## 2022-09-14 DIAGNOSIS — R2689 Other abnormalities of gait and mobility: Secondary | ICD-10-CM

## 2022-09-14 DIAGNOSIS — R269 Unspecified abnormalities of gait and mobility: Secondary | ICD-10-CM

## 2022-09-14 DIAGNOSIS — R799 Abnormal finding of blood chemistry, unspecified: Secondary | ICD-10-CM

## 2022-09-14 DIAGNOSIS — W19XXXA Unspecified fall, initial encounter: Secondary | ICD-10-CM

## 2022-09-14 DIAGNOSIS — S51811A Laceration without foreign body of right forearm, initial encounter: Secondary | ICD-10-CM

## 2022-09-14 DIAGNOSIS — E039 Hypothyroidism, unspecified: Secondary | ICD-10-CM

## 2022-09-14 DIAGNOSIS — F0390 Unspecified dementia without behavioral disturbance: Secondary | ICD-10-CM

## 2022-09-14 DIAGNOSIS — I509 Heart failure, unspecified: Secondary | ICD-10-CM

## 2022-09-14 DIAGNOSIS — N184 Chronic kidney disease, stage 4 (severe): Secondary | ICD-10-CM

## 2022-09-27 DIAGNOSIS — F0390 Unspecified dementia without behavioral disturbance: Secondary | ICD-10-CM | POA: Diagnosis not present

## 2022-09-27 DIAGNOSIS — Z4801 Encounter for change or removal of surgical wound dressing: Secondary | ICD-10-CM | POA: Diagnosis not present

## 2022-09-27 DIAGNOSIS — C4440 Unspecified malignant neoplasm of skin of scalp and neck: Secondary | ICD-10-CM | POA: Diagnosis not present

## 2022-09-27 DIAGNOSIS — I739 Peripheral vascular disease, unspecified: Secondary | ICD-10-CM | POA: Diagnosis not present

## 2022-10-03 DIAGNOSIS — L989 Disorder of the skin and subcutaneous tissue, unspecified: Secondary | ICD-10-CM | POA: Diagnosis not present

## 2022-10-03 DIAGNOSIS — F0390 Unspecified dementia without behavioral disturbance: Secondary | ICD-10-CM | POA: Diagnosis not present

## 2022-10-03 DIAGNOSIS — L853 Xerosis cutis: Secondary | ICD-10-CM | POA: Diagnosis not present

## 2022-10-14 ENCOUNTER — Ambulatory Visit (INDEPENDENT_AMBULATORY_CARE_PROVIDER_SITE_OTHER): Payer: Medicare Other

## 2022-10-14 DIAGNOSIS — I441 Atrioventricular block, second degree: Secondary | ICD-10-CM

## 2022-10-20 ENCOUNTER — Telehealth: Payer: Self-pay

## 2022-10-20 LAB — CUP PACEART REMOTE DEVICE CHECK
Battery Remaining Longevity: 27 mo
Battery Voltage: 2.94 V
Brady Statistic AP VP Percent: 23.91 %
Brady Statistic AP VS Percent: 14.44 %
Brady Statistic AS VP Percent: 29.22 %
Brady Statistic AS VS Percent: 32.43 %
Brady Statistic RA Percent Paced: 37.93 %
Brady Statistic RV Percent Paced: 52.73 %
Date Time Interrogation Session: 20240403150709
Implantable Lead Connection Status: 753985
Implantable Lead Connection Status: 753985
Implantable Lead Implant Date: 20160524
Implantable Lead Implant Date: 20160524
Implantable Lead Location: 753859
Implantable Lead Location: 753860
Implantable Lead Model: 5076
Implantable Lead Model: 5076
Implantable Pulse Generator Implant Date: 20160524
Lead Channel Impedance Value: 399 Ohm
Lead Channel Impedance Value: 456 Ohm
Lead Channel Impedance Value: 551 Ohm
Lead Channel Impedance Value: 608 Ohm
Lead Channel Pacing Threshold Amplitude: 0.625 V
Lead Channel Pacing Threshold Amplitude: 0.875 V
Lead Channel Pacing Threshold Pulse Width: 0.4 ms
Lead Channel Pacing Threshold Pulse Width: 0.4 ms
Lead Channel Sensing Intrinsic Amplitude: 28.875 mV
Lead Channel Sensing Intrinsic Amplitude: 28.875 mV
Lead Channel Sensing Intrinsic Amplitude: 3.375 mV
Lead Channel Sensing Intrinsic Amplitude: 3.375 mV
Lead Channel Setting Pacing Amplitude: 1.25 V
Lead Channel Setting Pacing Amplitude: 2.5 V
Lead Channel Setting Pacing Pulse Width: 0.4 ms
Lead Channel Setting Sensing Sensitivity: 0.9 mV
Zone Setting Status: 755011
Zone Setting Status: 755011

## 2022-10-20 NOTE — Telephone Encounter (Signed)
Spoke to patients son Gaspar Bidding, advised patient moved to [place], [place]. Recommended patient find EP provider in area close to where he resides. Son Gaspar Bidding states he will start working to find patient a cardiologist there and call back so we can transfer his remotes out.

## 2022-10-20 NOTE — Telephone Encounter (Signed)
-----   Message from Alben Spittle sent at 10/20/2022  1:08 PM EDT ----- Regarding: RE: overdue ~ Needs apt I called to schedule the appt and [facility] said he left Monday and moved some where close to [facility]. ----- Message ----- From: Simone Curia, RN Sent: 10/20/2022   8:45 AM EDT To: Alben Spittle Subject: overdue ~ Needs apt                            Patient is over, needs apt.   Thanks, Marliss Czar, RN

## 2022-10-27 ENCOUNTER — Ambulatory Visit: Payer: Medicare Other | Admitting: Podiatry

## 2022-11-15 NOTE — Progress Notes (Signed)
Remote pacemaker transmission.   

## 2022-11-22 ENCOUNTER — Telehealth: Payer: Self-pay

## 2022-11-22 NOTE — Telephone Encounter (Signed)
The patient is now being followed by [facility] in [place], [place]

## 2022-12-17 DEATH — deceased
